# Patient Record
Sex: Female | Born: 1965
Health system: Southern US, Community
[De-identification: ages and names within clinical notes are randomized; demographics above are authoritative.]

## PROBLEM LIST (undated history)

## (undated) DIAGNOSIS — Z95828 Presence of other vascular implants and grafts: Secondary | ICD-10-CM

## (undated) DIAGNOSIS — G4730 Sleep apnea, unspecified: Secondary | ICD-10-CM

## (undated) DIAGNOSIS — R197 Diarrhea, unspecified: Secondary | ICD-10-CM

## (undated) DIAGNOSIS — A419 Sepsis, unspecified organism: Secondary | ICD-10-CM

## (undated) DIAGNOSIS — F419 Anxiety disorder, unspecified: Secondary | ICD-10-CM

## (undated) DIAGNOSIS — G2581 Restless legs syndrome: Secondary | ICD-10-CM

## (undated) DIAGNOSIS — E785 Hyperlipidemia, unspecified: Secondary | ICD-10-CM

## (undated) DIAGNOSIS — M47816 Spondylosis without myelopathy or radiculopathy, lumbar region: Secondary | ICD-10-CM

## (undated) DIAGNOSIS — F32A Depression, unspecified: Secondary | ICD-10-CM

## (undated) DIAGNOSIS — J45909 Unspecified asthma, uncomplicated: Secondary | ICD-10-CM

## (undated) DIAGNOSIS — K219 Gastro-esophageal reflux disease without esophagitis: Secondary | ICD-10-CM

## (undated) HISTORY — DX: Spondylosis without myelopathy or radiculopathy, lumbar region: M47.816

## (undated) HISTORY — DX: Restless legs syndrome: G25.81

## (undated) HISTORY — DX: Sepsis, unspecified organism: A41.9

## (undated) HISTORY — DX: Diarrhea, unspecified: R19.7

## (undated) HISTORY — DX: Unspecified asthma, uncomplicated: J45.909

## (undated) HISTORY — DX: Hyperlipidemia, unspecified: E78.5

## (undated) HISTORY — PX: TONSILLECTOMY: SUR1361

## (undated) HISTORY — DX: Depression, unspecified: F32.A

## (undated) HISTORY — DX: Gastro-esophageal reflux disease without esophagitis: K21.9

## (undated) HISTORY — DX: Sleep apnea, unspecified: G47.30

## (undated) HISTORY — DX: Presence of other vascular implants and grafts: Z95.828

## (undated) HISTORY — DX: Anxiety disorder, unspecified: F41.9

## (undated) HISTORY — PX: OTHER SURGICAL HISTORY: SHX169

## (undated) HISTORY — PX: SPINAL FUSION: SHX223

## (undated) HISTORY — PX: APPENDECTOMY: SHX54

## (undated) HISTORY — PX: SPINAL CORD STIMULATOR INSERTION: SHX5378

## (undated) HISTORY — PX: ECTOPIC PREGNANCY SURGERY: SHX613

---

## 2013-10-28 DIAGNOSIS — M47817 Spondylosis without myelopathy or radiculopathy, lumbosacral region: Secondary | ICD-10-CM | POA: Diagnosis not present

## 2013-10-28 DIAGNOSIS — Z981 Arthrodesis status: Secondary | ICD-10-CM | POA: Diagnosis not present

## 2013-10-28 DIAGNOSIS — M5126 Other intervertebral disc displacement, lumbar region: Secondary | ICD-10-CM | POA: Diagnosis not present

## 2013-10-28 DIAGNOSIS — M48061 Spinal stenosis, lumbar region without neurogenic claudication: Secondary | ICD-10-CM | POA: Diagnosis not present

## 2013-11-03 DIAGNOSIS — M47817 Spondylosis without myelopathy or radiculopathy, lumbosacral region: Secondary | ICD-10-CM | POA: Diagnosis not present

## 2013-11-03 DIAGNOSIS — M4310 Spondylolisthesis, site unspecified: Secondary | ICD-10-CM | POA: Diagnosis not present

## 2013-12-02 DIAGNOSIS — R5381 Other malaise: Secondary | ICD-10-CM | POA: Diagnosis not present

## 2013-12-02 DIAGNOSIS — Z0000 Encounter for general adult medical examination without abnormal findings: Secondary | ICD-10-CM | POA: Diagnosis not present

## 2013-12-02 DIAGNOSIS — E7800 Pure hypercholesterolemia, unspecified: Secondary | ICD-10-CM | POA: Diagnosis not present

## 2013-12-02 DIAGNOSIS — R5383 Other fatigue: Secondary | ICD-10-CM | POA: Diagnosis not present

## 2013-12-06 DIAGNOSIS — E7800 Pure hypercholesterolemia, unspecified: Secondary | ICD-10-CM | POA: Diagnosis not present

## 2013-12-06 DIAGNOSIS — R5383 Other fatigue: Secondary | ICD-10-CM | POA: Diagnosis not present

## 2013-12-06 DIAGNOSIS — R5381 Other malaise: Secondary | ICD-10-CM | POA: Diagnosis not present

## 2013-12-09 DIAGNOSIS — M545 Low back pain, unspecified: Secondary | ICD-10-CM | POA: Diagnosis not present

## 2013-12-09 DIAGNOSIS — J45909 Unspecified asthma, uncomplicated: Secondary | ICD-10-CM | POA: Diagnosis not present

## 2013-12-09 DIAGNOSIS — R339 Retention of urine, unspecified: Secondary | ICD-10-CM | POA: Diagnosis not present

## 2013-12-09 DIAGNOSIS — N3090 Cystitis, unspecified without hematuria: Secondary | ICD-10-CM | POA: Diagnosis not present

## 2013-12-09 DIAGNOSIS — E669 Obesity, unspecified: Secondary | ICD-10-CM | POA: Diagnosis not present

## 2013-12-12 DIAGNOSIS — N926 Irregular menstruation, unspecified: Secondary | ICD-10-CM | POA: Diagnosis not present

## 2013-12-12 DIAGNOSIS — N951 Menopausal and female climacteric states: Secondary | ICD-10-CM | POA: Diagnosis not present

## 2013-12-15 DIAGNOSIS — Z1231 Encounter for screening mammogram for malignant neoplasm of breast: Secondary | ICD-10-CM | POA: Diagnosis not present

## 2013-12-21 DIAGNOSIS — N926 Irregular menstruation, unspecified: Secondary | ICD-10-CM | POA: Diagnosis not present

## 2014-01-12 DIAGNOSIS — N926 Irregular menstruation, unspecified: Secondary | ICD-10-CM | POA: Diagnosis not present

## 2014-01-12 DIAGNOSIS — Z049 Encounter for examination and observation for unspecified reason: Secondary | ICD-10-CM | POA: Diagnosis not present

## 2014-01-12 DIAGNOSIS — R635 Abnormal weight gain: Secondary | ICD-10-CM | POA: Diagnosis not present

## 2014-02-02 DIAGNOSIS — M4310 Spondylolisthesis, site unspecified: Secondary | ICD-10-CM | POA: Diagnosis not present

## 2014-02-02 DIAGNOSIS — M47817 Spondylosis without myelopathy or radiculopathy, lumbosacral region: Secondary | ICD-10-CM | POA: Diagnosis not present

## 2014-07-04 DIAGNOSIS — M47817 Spondylosis without myelopathy or radiculopathy, lumbosacral region: Secondary | ICD-10-CM | POA: Diagnosis not present

## 2014-07-04 DIAGNOSIS — M4310 Spondylolisthesis, site unspecified: Secondary | ICD-10-CM | POA: Diagnosis not present

## 2014-07-18 DIAGNOSIS — H6091 Unspecified otitis externa, right ear: Secondary | ICD-10-CM | POA: Diagnosis not present

## 2014-07-18 DIAGNOSIS — R0982 Postnasal drip: Secondary | ICD-10-CM | POA: Diagnosis not present

## 2014-07-31 DIAGNOSIS — J209 Acute bronchitis, unspecified: Secondary | ICD-10-CM | POA: Diagnosis not present

## 2014-11-01 DIAGNOSIS — F411 Generalized anxiety disorder: Secondary | ICD-10-CM | POA: Diagnosis not present

## 2014-11-02 DIAGNOSIS — M47816 Spondylosis without myelopathy or radiculopathy, lumbar region: Secondary | ICD-10-CM | POA: Diagnosis not present

## 2014-11-02 DIAGNOSIS — M4316 Spondylolisthesis, lumbar region: Secondary | ICD-10-CM | POA: Diagnosis not present

## 2015-01-02 DIAGNOSIS — M4316 Spondylolisthesis, lumbar region: Secondary | ICD-10-CM | POA: Diagnosis not present

## 2015-01-02 DIAGNOSIS — M47816 Spondylosis without myelopathy or radiculopathy, lumbar region: Secondary | ICD-10-CM | POA: Diagnosis not present

## 2015-01-16 DIAGNOSIS — N921 Excessive and frequent menstruation with irregular cycle: Secondary | ICD-10-CM | POA: Diagnosis not present

## 2015-01-16 DIAGNOSIS — N951 Menopausal and female climacteric states: Secondary | ICD-10-CM | POA: Diagnosis not present

## 2015-01-22 DIAGNOSIS — N8501 Benign endometrial hyperplasia: Secondary | ICD-10-CM | POA: Diagnosis not present

## 2015-01-24 DIAGNOSIS — N920 Excessive and frequent menstruation with regular cycle: Secondary | ICD-10-CM | POA: Diagnosis not present

## 2015-01-24 DIAGNOSIS — N943 Premenstrual tension syndrome: Secondary | ICD-10-CM | POA: Diagnosis not present

## 2015-02-13 DIAGNOSIS — J028 Acute pharyngitis due to other specified organisms: Secondary | ICD-10-CM | POA: Diagnosis not present

## 2015-04-19 DIAGNOSIS — M47816 Spondylosis without myelopathy or radiculopathy, lumbar region: Secondary | ICD-10-CM | POA: Diagnosis not present

## 2015-04-19 DIAGNOSIS — M4316 Spondylolisthesis, lumbar region: Secondary | ICD-10-CM | POA: Diagnosis not present

## 2015-05-15 DIAGNOSIS — B850 Pediculosis due to Pediculus humanus capitis: Secondary | ICD-10-CM | POA: Diagnosis not present

## 2015-06-18 DIAGNOSIS — J029 Acute pharyngitis, unspecified: Secondary | ICD-10-CM | POA: Diagnosis not present

## 2015-06-18 DIAGNOSIS — R5383 Other fatigue: Secondary | ICD-10-CM | POA: Diagnosis not present

## 2015-06-18 DIAGNOSIS — F411 Generalized anxiety disorder: Secondary | ICD-10-CM | POA: Diagnosis not present

## 2015-06-18 DIAGNOSIS — R05 Cough: Secondary | ICD-10-CM | POA: Diagnosis not present

## 2015-07-12 DIAGNOSIS — G43909 Migraine, unspecified, not intractable, without status migrainosus: Secondary | ICD-10-CM | POA: Diagnosis not present

## 2015-07-30 DIAGNOSIS — M4316 Spondylolisthesis, lumbar region: Secondary | ICD-10-CM | POA: Diagnosis not present

## 2015-07-30 DIAGNOSIS — M47816 Spondylosis without myelopathy or radiculopathy, lumbar region: Secondary | ICD-10-CM | POA: Diagnosis not present

## 2015-07-31 DIAGNOSIS — M4326 Fusion of spine, lumbar region: Secondary | ICD-10-CM | POA: Diagnosis not present

## 2015-07-31 DIAGNOSIS — M47816 Spondylosis without myelopathy or radiculopathy, lumbar region: Secondary | ICD-10-CM | POA: Diagnosis not present

## 2015-07-31 DIAGNOSIS — Z981 Arthrodesis status: Secondary | ICD-10-CM | POA: Diagnosis not present

## 2015-08-06 DIAGNOSIS — J209 Acute bronchitis, unspecified: Secondary | ICD-10-CM | POA: Diagnosis not present

## 2015-08-06 DIAGNOSIS — R079 Chest pain, unspecified: Secondary | ICD-10-CM | POA: Diagnosis not present

## 2015-08-06 DIAGNOSIS — F411 Generalized anxiety disorder: Secondary | ICD-10-CM | POA: Diagnosis not present

## 2015-08-06 DIAGNOSIS — R05 Cough: Secondary | ICD-10-CM | POA: Diagnosis not present

## 2015-08-16 DIAGNOSIS — F411 Generalized anxiety disorder: Secondary | ICD-10-CM | POA: Diagnosis not present

## 2015-08-16 DIAGNOSIS — F5101 Primary insomnia: Secondary | ICD-10-CM | POA: Diagnosis not present

## 2015-08-27 DIAGNOSIS — J029 Acute pharyngitis, unspecified: Secondary | ICD-10-CM | POA: Diagnosis not present

## 2015-08-27 DIAGNOSIS — H9201 Otalgia, right ear: Secondary | ICD-10-CM | POA: Diagnosis not present

## 2015-09-06 HISTORY — PX: COLONOSCOPY: SHX174

## 2015-09-12 DIAGNOSIS — M7711 Lateral epicondylitis, right elbow: Secondary | ICD-10-CM | POA: Diagnosis not present

## 2015-09-12 DIAGNOSIS — F411 Generalized anxiety disorder: Secondary | ICD-10-CM | POA: Diagnosis not present

## 2015-09-13 DIAGNOSIS — K625 Hemorrhage of anus and rectum: Secondary | ICD-10-CM | POA: Diagnosis not present

## 2015-09-18 DIAGNOSIS — K644 Residual hemorrhoidal skin tags: Secondary | ICD-10-CM | POA: Diagnosis not present

## 2015-09-18 DIAGNOSIS — Z880 Allergy status to penicillin: Secondary | ICD-10-CM | POA: Diagnosis not present

## 2015-09-18 DIAGNOSIS — Z808 Family history of malignant neoplasm of other organs or systems: Secondary | ICD-10-CM | POA: Diagnosis not present

## 2015-09-18 DIAGNOSIS — K6289 Other specified diseases of anus and rectum: Secondary | ICD-10-CM | POA: Diagnosis not present

## 2015-09-18 DIAGNOSIS — Z87891 Personal history of nicotine dependence: Secondary | ICD-10-CM | POA: Diagnosis not present

## 2015-09-18 DIAGNOSIS — F419 Anxiety disorder, unspecified: Secondary | ICD-10-CM | POA: Diagnosis not present

## 2015-09-18 DIAGNOSIS — K5730 Diverticulosis of large intestine without perforation or abscess without bleeding: Secondary | ICD-10-CM | POA: Diagnosis not present

## 2015-09-18 DIAGNOSIS — Z8041 Family history of malignant neoplasm of ovary: Secondary | ICD-10-CM | POA: Diagnosis not present

## 2015-09-18 DIAGNOSIS — Z79899 Other long term (current) drug therapy: Secondary | ICD-10-CM | POA: Diagnosis not present

## 2015-09-18 DIAGNOSIS — E7800 Pure hypercholesterolemia, unspecified: Secondary | ICD-10-CM | POA: Diagnosis not present

## 2015-09-18 DIAGNOSIS — G43909 Migraine, unspecified, not intractable, without status migrainosus: Secondary | ICD-10-CM | POA: Diagnosis not present

## 2015-09-18 DIAGNOSIS — J45909 Unspecified asthma, uncomplicated: Secondary | ICD-10-CM | POA: Diagnosis not present

## 2015-09-18 DIAGNOSIS — K625 Hemorrhage of anus and rectum: Secondary | ICD-10-CM | POA: Diagnosis not present

## 2015-09-18 DIAGNOSIS — Z836 Family history of other diseases of the respiratory system: Secondary | ICD-10-CM | POA: Diagnosis not present

## 2015-09-18 DIAGNOSIS — K641 Second degree hemorrhoids: Secondary | ICD-10-CM | POA: Diagnosis not present

## 2015-09-21 DIAGNOSIS — M7711 Lateral epicondylitis, right elbow: Secondary | ICD-10-CM | POA: Diagnosis not present

## 2015-09-21 DIAGNOSIS — F411 Generalized anxiety disorder: Secondary | ICD-10-CM | POA: Diagnosis not present

## 2015-10-24 DIAGNOSIS — R110 Nausea: Secondary | ICD-10-CM | POA: Diagnosis not present

## 2015-10-24 DIAGNOSIS — G43909 Migraine, unspecified, not intractable, without status migrainosus: Secondary | ICD-10-CM | POA: Diagnosis not present

## 2015-10-24 DIAGNOSIS — J0101 Acute recurrent maxillary sinusitis: Secondary | ICD-10-CM | POA: Diagnosis not present

## 2015-10-30 DIAGNOSIS — M4316 Spondylolisthesis, lumbar region: Secondary | ICD-10-CM | POA: Diagnosis not present

## 2015-10-30 DIAGNOSIS — M47816 Spondylosis without myelopathy or radiculopathy, lumbar region: Secondary | ICD-10-CM | POA: Diagnosis not present

## 2015-11-15 DIAGNOSIS — J209 Acute bronchitis, unspecified: Secondary | ICD-10-CM | POA: Diagnosis not present

## 2015-11-15 DIAGNOSIS — R509 Fever, unspecified: Secondary | ICD-10-CM | POA: Diagnosis not present

## 2015-11-15 DIAGNOSIS — J020 Streptococcal pharyngitis: Secondary | ICD-10-CM | POA: Diagnosis not present

## 2016-02-05 DIAGNOSIS — M4316 Spondylolisthesis, lumbar region: Secondary | ICD-10-CM | POA: Diagnosis not present

## 2016-02-05 DIAGNOSIS — M47816 Spondylosis without myelopathy or radiculopathy, lumbar region: Secondary | ICD-10-CM | POA: Diagnosis not present

## 2016-02-07 DIAGNOSIS — J019 Acute sinusitis, unspecified: Secondary | ICD-10-CM | POA: Diagnosis not present

## 2016-02-07 DIAGNOSIS — R0981 Nasal congestion: Secondary | ICD-10-CM | POA: Diagnosis not present

## 2016-02-07 DIAGNOSIS — R05 Cough: Secondary | ICD-10-CM | POA: Diagnosis not present

## 2016-03-11 DIAGNOSIS — J45909 Unspecified asthma, uncomplicated: Secondary | ICD-10-CM | POA: Diagnosis not present

## 2016-03-11 DIAGNOSIS — F419 Anxiety disorder, unspecified: Secondary | ICD-10-CM | POA: Diagnosis not present

## 2016-03-11 DIAGNOSIS — M479 Spondylosis, unspecified: Secondary | ICD-10-CM | POA: Diagnosis not present

## 2016-03-11 DIAGNOSIS — Z79899 Other long term (current) drug therapy: Secondary | ICD-10-CM | POA: Diagnosis not present

## 2016-03-11 DIAGNOSIS — M545 Low back pain: Secondary | ICD-10-CM | POA: Diagnosis not present

## 2016-03-11 DIAGNOSIS — R1030 Lower abdominal pain, unspecified: Secondary | ICD-10-CM | POA: Diagnosis not present

## 2016-03-11 DIAGNOSIS — R109 Unspecified abdominal pain: Secondary | ICD-10-CM | POA: Diagnosis not present

## 2016-03-11 DIAGNOSIS — M549 Dorsalgia, unspecified: Secondary | ICD-10-CM | POA: Diagnosis not present

## 2016-03-11 DIAGNOSIS — G8929 Other chronic pain: Secondary | ICD-10-CM | POA: Diagnosis not present

## 2016-05-07 DIAGNOSIS — M47816 Spondylosis without myelopathy or radiculopathy, lumbar region: Secondary | ICD-10-CM | POA: Diagnosis not present

## 2016-05-07 DIAGNOSIS — M4316 Spondylolisthesis, lumbar region: Secondary | ICD-10-CM | POA: Diagnosis not present

## 2016-06-10 DIAGNOSIS — F4542 Pain disorder with related psychological factors: Secondary | ICD-10-CM | POA: Diagnosis not present

## 2016-06-10 DIAGNOSIS — F411 Generalized anxiety disorder: Secondary | ICD-10-CM | POA: Diagnosis not present

## 2016-06-10 DIAGNOSIS — F341 Dysthymic disorder: Secondary | ICD-10-CM | POA: Diagnosis not present

## 2016-06-10 DIAGNOSIS — M4726 Other spondylosis with radiculopathy, lumbar region: Secondary | ICD-10-CM | POA: Diagnosis not present

## 2016-07-11 DIAGNOSIS — M47816 Spondylosis without myelopathy or radiculopathy, lumbar region: Secondary | ICD-10-CM | POA: Diagnosis not present

## 2016-07-11 DIAGNOSIS — M4316 Spondylolisthesis, lumbar region: Secondary | ICD-10-CM | POA: Diagnosis not present

## 2016-08-13 DIAGNOSIS — J029 Acute pharyngitis, unspecified: Secondary | ICD-10-CM | POA: Diagnosis not present

## 2016-08-13 DIAGNOSIS — Z6832 Body mass index (BMI) 32.0-32.9, adult: Secondary | ICD-10-CM | POA: Diagnosis not present

## 2016-08-13 DIAGNOSIS — F411 Generalized anxiety disorder: Secondary | ICD-10-CM | POA: Diagnosis not present

## 2016-08-18 DIAGNOSIS — M47896 Other spondylosis, lumbar region: Secondary | ICD-10-CM | POA: Diagnosis not present

## 2016-08-20 DIAGNOSIS — Z969 Presence of functional implant, unspecified: Secondary | ICD-10-CM | POA: Diagnosis not present

## 2016-08-20 DIAGNOSIS — M4716 Other spondylosis with myelopathy, lumbar region: Secondary | ICD-10-CM | POA: Diagnosis not present

## 2016-08-20 DIAGNOSIS — M47896 Other spondylosis, lumbar region: Secondary | ICD-10-CM | POA: Diagnosis not present

## 2016-08-20 DIAGNOSIS — M47816 Spondylosis without myelopathy or radiculopathy, lumbar region: Secondary | ICD-10-CM | POA: Diagnosis not present

## 2016-08-21 DIAGNOSIS — M47896 Other spondylosis, lumbar region: Secondary | ICD-10-CM | POA: Diagnosis not present

## 2016-08-25 DIAGNOSIS — M4316 Spondylolisthesis, lumbar region: Secondary | ICD-10-CM | POA: Diagnosis not present

## 2016-08-25 DIAGNOSIS — M47896 Other spondylosis, lumbar region: Secondary | ICD-10-CM | POA: Diagnosis not present

## 2016-08-25 DIAGNOSIS — M47816 Spondylosis without myelopathy or radiculopathy, lumbar region: Secondary | ICD-10-CM | POA: Diagnosis not present

## 2016-08-26 DIAGNOSIS — M47896 Other spondylosis, lumbar region: Secondary | ICD-10-CM | POA: Diagnosis not present

## 2016-08-26 DIAGNOSIS — M4316 Spondylolisthesis, lumbar region: Secondary | ICD-10-CM | POA: Diagnosis not present

## 2016-08-30 DIAGNOSIS — R2689 Other abnormalities of gait and mobility: Secondary | ICD-10-CM | POA: Diagnosis not present

## 2016-08-30 DIAGNOSIS — Z4789 Encounter for other orthopedic aftercare: Secondary | ICD-10-CM | POA: Diagnosis not present

## 2016-08-30 DIAGNOSIS — M6281 Muscle weakness (generalized): Secondary | ICD-10-CM | POA: Diagnosis not present

## 2016-08-30 DIAGNOSIS — M47816 Spondylosis without myelopathy or radiculopathy, lumbar region: Secondary | ICD-10-CM | POA: Diagnosis not present

## 2016-08-31 DIAGNOSIS — B999 Unspecified infectious disease: Secondary | ICD-10-CM | POA: Diagnosis not present

## 2016-08-31 DIAGNOSIS — F419 Anxiety disorder, unspecified: Secondary | ICD-10-CM | POA: Diagnosis not present

## 2016-08-31 DIAGNOSIS — T814XXA Infection following a procedure, initial encounter: Secondary | ICD-10-CM | POA: Diagnosis not present

## 2016-08-31 DIAGNOSIS — Z79899 Other long term (current) drug therapy: Secondary | ICD-10-CM | POA: Diagnosis not present

## 2016-09-01 DIAGNOSIS — M47816 Spondylosis without myelopathy or radiculopathy, lumbar region: Secondary | ICD-10-CM | POA: Diagnosis not present

## 2016-09-01 DIAGNOSIS — R2689 Other abnormalities of gait and mobility: Secondary | ICD-10-CM | POA: Diagnosis not present

## 2016-09-01 DIAGNOSIS — Z4789 Encounter for other orthopedic aftercare: Secondary | ICD-10-CM | POA: Diagnosis not present

## 2016-09-01 DIAGNOSIS — M6281 Muscle weakness (generalized): Secondary | ICD-10-CM | POA: Diagnosis not present

## 2016-09-02 DIAGNOSIS — R2689 Other abnormalities of gait and mobility: Secondary | ICD-10-CM | POA: Diagnosis not present

## 2016-09-02 DIAGNOSIS — M47816 Spondylosis without myelopathy or radiculopathy, lumbar region: Secondary | ICD-10-CM | POA: Diagnosis not present

## 2016-09-02 DIAGNOSIS — M6281 Muscle weakness (generalized): Secondary | ICD-10-CM | POA: Diagnosis not present

## 2016-09-02 DIAGNOSIS — Z4789 Encounter for other orthopedic aftercare: Secondary | ICD-10-CM | POA: Diagnosis not present

## 2016-09-03 DIAGNOSIS — Y838 Other surgical procedures as the cause of abnormal reaction of the patient, or of later complication, without mention of misadventure at the time of the procedure: Secondary | ICD-10-CM | POA: Diagnosis not present

## 2016-09-03 DIAGNOSIS — Z880 Allergy status to penicillin: Secondary | ICD-10-CM | POA: Diagnosis not present

## 2016-09-03 DIAGNOSIS — T8579XA Infection and inflammatory reaction due to other internal prosthetic devices, implants and grafts, initial encounter: Secondary | ICD-10-CM | POA: Diagnosis not present

## 2016-09-03 DIAGNOSIS — Z79899 Other long term (current) drug therapy: Secondary | ICD-10-CM | POA: Diagnosis not present

## 2016-09-03 DIAGNOSIS — T85192A Other mechanical complication of implanted electronic neurostimulator (electrode) of spinal cord, initial encounter: Secondary | ICD-10-CM | POA: Diagnosis not present

## 2016-09-03 DIAGNOSIS — Z981 Arthrodesis status: Secondary | ICD-10-CM | POA: Diagnosis not present

## 2016-09-03 DIAGNOSIS — Z791 Long term (current) use of non-steroidal anti-inflammatories (NSAID): Secondary | ICD-10-CM | POA: Diagnosis not present

## 2016-09-03 DIAGNOSIS — Z825 Family history of asthma and other chronic lower respiratory diseases: Secondary | ICD-10-CM | POA: Diagnosis not present

## 2016-09-03 DIAGNOSIS — Z79891 Long term (current) use of opiate analgesic: Secondary | ICD-10-CM | POA: Diagnosis not present

## 2016-09-03 DIAGNOSIS — Z888 Allergy status to other drugs, medicaments and biological substances status: Secondary | ICD-10-CM | POA: Diagnosis not present

## 2016-09-03 DIAGNOSIS — J449 Chronic obstructive pulmonary disease, unspecified: Secondary | ICD-10-CM | POA: Diagnosis not present

## 2016-09-03 DIAGNOSIS — T85734A Infection and inflammatory reaction due to implanted electronic neurostimulator, generator, initial encounter: Secondary | ICD-10-CM | POA: Diagnosis not present

## 2016-09-03 DIAGNOSIS — Y753 Surgical instruments, materials and neurological devices (including sutures) associated with adverse incidents: Secondary | ICD-10-CM | POA: Diagnosis not present

## 2016-09-03 DIAGNOSIS — Z809 Family history of malignant neoplasm, unspecified: Secondary | ICD-10-CM | POA: Diagnosis not present

## 2016-09-04 DIAGNOSIS — Z452 Encounter for adjustment and management of vascular access device: Secondary | ICD-10-CM | POA: Diagnosis not present

## 2016-09-04 DIAGNOSIS — Z79899 Other long term (current) drug therapy: Secondary | ICD-10-CM | POA: Diagnosis not present

## 2016-09-04 DIAGNOSIS — M545 Low back pain: Secondary | ICD-10-CM | POA: Diagnosis not present

## 2016-09-04 DIAGNOSIS — T85734A Infection and inflammatory reaction due to implanted electronic neurostimulator, generator, initial encounter: Secondary | ICD-10-CM | POA: Diagnosis present

## 2016-09-04 DIAGNOSIS — Z79891 Long term (current) use of opiate analgesic: Secondary | ICD-10-CM | POA: Diagnosis not present

## 2016-09-04 DIAGNOSIS — J449 Chronic obstructive pulmonary disease, unspecified: Secondary | ICD-10-CM | POA: Diagnosis present

## 2016-09-04 DIAGNOSIS — Z791 Long term (current) use of non-steroidal anti-inflammatories (NSAID): Secondary | ICD-10-CM | POA: Diagnosis not present

## 2016-09-04 DIAGNOSIS — Z880 Allergy status to penicillin: Secondary | ICD-10-CM | POA: Diagnosis not present

## 2016-09-04 DIAGNOSIS — T85733A Infection and inflammatory reaction due to implanted electronic neurostimulator of spinal cord, electrode (lead), initial encounter: Secondary | ICD-10-CM | POA: Diagnosis not present

## 2016-09-04 DIAGNOSIS — Z792 Long term (current) use of antibiotics: Secondary | ICD-10-CM | POA: Diagnosis not present

## 2016-09-04 DIAGNOSIS — G8929 Other chronic pain: Secondary | ICD-10-CM | POA: Diagnosis not present

## 2016-09-04 DIAGNOSIS — Z825 Family history of asthma and other chronic lower respiratory diseases: Secondary | ICD-10-CM | POA: Diagnosis not present

## 2016-09-04 DIAGNOSIS — Z981 Arthrodesis status: Secondary | ICD-10-CM | POA: Diagnosis not present

## 2016-09-04 DIAGNOSIS — Z888 Allergy status to other drugs, medicaments and biological substances status: Secondary | ICD-10-CM | POA: Diagnosis not present

## 2016-09-04 DIAGNOSIS — Z809 Family history of malignant neoplasm, unspecified: Secondary | ICD-10-CM | POA: Diagnosis not present

## 2016-09-08 DIAGNOSIS — G8929 Other chronic pain: Secondary | ICD-10-CM | POA: Diagnosis not present

## 2016-09-08 DIAGNOSIS — J449 Chronic obstructive pulmonary disease, unspecified: Secondary | ICD-10-CM | POA: Diagnosis not present

## 2016-09-08 DIAGNOSIS — T85733A Infection and inflammatory reaction due to implanted electronic neurostimulator of spinal cord, electrode (lead), initial encounter: Secondary | ICD-10-CM | POA: Diagnosis not present

## 2016-09-08 DIAGNOSIS — Z792 Long term (current) use of antibiotics: Secondary | ICD-10-CM | POA: Diagnosis not present

## 2016-09-08 DIAGNOSIS — Z452 Encounter for adjustment and management of vascular access device: Secondary | ICD-10-CM | POA: Diagnosis not present

## 2016-09-08 DIAGNOSIS — M545 Low back pain: Secondary | ICD-10-CM | POA: Diagnosis not present

## 2016-09-10 DIAGNOSIS — J449 Chronic obstructive pulmonary disease, unspecified: Secondary | ICD-10-CM | POA: Diagnosis not present

## 2016-09-10 DIAGNOSIS — M545 Low back pain: Secondary | ICD-10-CM | POA: Diagnosis not present

## 2016-09-10 DIAGNOSIS — T85733A Infection and inflammatory reaction due to implanted electronic neurostimulator of spinal cord, electrode (lead), initial encounter: Secondary | ICD-10-CM | POA: Diagnosis not present

## 2016-09-10 DIAGNOSIS — G8929 Other chronic pain: Secondary | ICD-10-CM | POA: Diagnosis not present

## 2016-09-10 DIAGNOSIS — Z792 Long term (current) use of antibiotics: Secondary | ICD-10-CM | POA: Diagnosis not present

## 2016-09-10 DIAGNOSIS — Z452 Encounter for adjustment and management of vascular access device: Secondary | ICD-10-CM | POA: Diagnosis not present

## 2016-09-10 DIAGNOSIS — T8579XA Infection and inflammatory reaction due to other internal prosthetic devices, implants and grafts, initial encounter: Secondary | ICD-10-CM | POA: Diagnosis not present

## 2016-09-10 DIAGNOSIS — A499 Bacterial infection, unspecified: Secondary | ICD-10-CM | POA: Diagnosis not present

## 2016-09-17 DIAGNOSIS — J449 Chronic obstructive pulmonary disease, unspecified: Secondary | ICD-10-CM | POA: Diagnosis not present

## 2016-09-17 DIAGNOSIS — Z792 Long term (current) use of antibiotics: Secondary | ICD-10-CM | POA: Diagnosis not present

## 2016-09-17 DIAGNOSIS — M545 Low back pain: Secondary | ICD-10-CM | POA: Diagnosis not present

## 2016-09-17 DIAGNOSIS — M4636 Infection of intervertebral disc (pyogenic), lumbar region: Secondary | ICD-10-CM | POA: Diagnosis not present

## 2016-09-17 DIAGNOSIS — G8929 Other chronic pain: Secondary | ICD-10-CM | POA: Diagnosis not present

## 2016-09-17 DIAGNOSIS — T85733A Infection and inflammatory reaction due to implanted electronic neurostimulator of spinal cord, electrode (lead), initial encounter: Secondary | ICD-10-CM | POA: Diagnosis not present

## 2016-09-17 DIAGNOSIS — Z452 Encounter for adjustment and management of vascular access device: Secondary | ICD-10-CM | POA: Diagnosis not present

## 2016-09-24 DIAGNOSIS — J449 Chronic obstructive pulmonary disease, unspecified: Secondary | ICD-10-CM | POA: Diagnosis not present

## 2016-09-24 DIAGNOSIS — M545 Low back pain: Secondary | ICD-10-CM | POA: Diagnosis not present

## 2016-09-24 DIAGNOSIS — Z452 Encounter for adjustment and management of vascular access device: Secondary | ICD-10-CM | POA: Diagnosis not present

## 2016-09-24 DIAGNOSIS — G8929 Other chronic pain: Secondary | ICD-10-CM | POA: Diagnosis not present

## 2016-09-24 DIAGNOSIS — T85733A Infection and inflammatory reaction due to implanted electronic neurostimulator of spinal cord, electrode (lead), initial encounter: Secondary | ICD-10-CM | POA: Diagnosis not present

## 2016-09-24 DIAGNOSIS — Z792 Long term (current) use of antibiotics: Secondary | ICD-10-CM | POA: Diagnosis not present

## 2016-10-01 DIAGNOSIS — Z792 Long term (current) use of antibiotics: Secondary | ICD-10-CM | POA: Diagnosis not present

## 2016-10-01 DIAGNOSIS — Z452 Encounter for adjustment and management of vascular access device: Secondary | ICD-10-CM | POA: Diagnosis not present

## 2016-10-01 DIAGNOSIS — T85733A Infection and inflammatory reaction due to implanted electronic neurostimulator of spinal cord, electrode (lead), initial encounter: Secondary | ICD-10-CM | POA: Diagnosis not present

## 2016-10-01 DIAGNOSIS — G8929 Other chronic pain: Secondary | ICD-10-CM | POA: Diagnosis not present

## 2016-10-01 DIAGNOSIS — J449 Chronic obstructive pulmonary disease, unspecified: Secondary | ICD-10-CM | POA: Diagnosis not present

## 2016-10-01 DIAGNOSIS — M545 Low back pain: Secondary | ICD-10-CM | POA: Diagnosis not present

## 2016-10-08 DIAGNOSIS — T85733A Infection and inflammatory reaction due to implanted electronic neurostimulator of spinal cord, electrode (lead), initial encounter: Secondary | ICD-10-CM | POA: Diagnosis not present

## 2016-10-08 DIAGNOSIS — G8929 Other chronic pain: Secondary | ICD-10-CM | POA: Diagnosis not present

## 2016-10-08 DIAGNOSIS — M545 Low back pain: Secondary | ICD-10-CM | POA: Diagnosis not present

## 2016-10-08 DIAGNOSIS — Z792 Long term (current) use of antibiotics: Secondary | ICD-10-CM | POA: Diagnosis not present

## 2016-10-08 DIAGNOSIS — Z452 Encounter for adjustment and management of vascular access device: Secondary | ICD-10-CM | POA: Diagnosis not present

## 2016-10-08 DIAGNOSIS — J449 Chronic obstructive pulmonary disease, unspecified: Secondary | ICD-10-CM | POA: Diagnosis not present

## 2016-10-13 DIAGNOSIS — Z87891 Personal history of nicotine dependence: Secondary | ICD-10-CM | POA: Diagnosis not present

## 2016-10-13 DIAGNOSIS — R51 Headache: Secondary | ICD-10-CM | POA: Diagnosis not present

## 2016-10-13 DIAGNOSIS — R091 Pleurisy: Secondary | ICD-10-CM | POA: Diagnosis not present

## 2016-10-13 DIAGNOSIS — R0602 Shortness of breath: Secondary | ICD-10-CM | POA: Diagnosis not present

## 2016-10-13 DIAGNOSIS — F419 Anxiety disorder, unspecified: Secondary | ICD-10-CM | POA: Diagnosis not present

## 2016-10-13 DIAGNOSIS — Z79899 Other long term (current) drug therapy: Secondary | ICD-10-CM | POA: Diagnosis not present

## 2016-10-13 DIAGNOSIS — Z791 Long term (current) use of non-steroidal anti-inflammatories (NSAID): Secondary | ICD-10-CM | POA: Diagnosis not present

## 2016-10-14 DIAGNOSIS — M545 Low back pain: Secondary | ICD-10-CM | POA: Diagnosis not present

## 2016-10-14 DIAGNOSIS — G8929 Other chronic pain: Secondary | ICD-10-CM | POA: Diagnosis not present

## 2016-10-14 DIAGNOSIS — J449 Chronic obstructive pulmonary disease, unspecified: Secondary | ICD-10-CM | POA: Diagnosis not present

## 2016-10-14 DIAGNOSIS — Z452 Encounter for adjustment and management of vascular access device: Secondary | ICD-10-CM | POA: Diagnosis not present

## 2016-10-14 DIAGNOSIS — Z792 Long term (current) use of antibiotics: Secondary | ICD-10-CM | POA: Diagnosis not present

## 2016-10-14 DIAGNOSIS — T85733A Infection and inflammatory reaction due to implanted electronic neurostimulator of spinal cord, electrode (lead), initial encounter: Secondary | ICD-10-CM | POA: Diagnosis not present

## 2016-10-17 DIAGNOSIS — Z792 Long term (current) use of antibiotics: Secondary | ICD-10-CM | POA: Diagnosis not present

## 2016-10-17 DIAGNOSIS — T85733A Infection and inflammatory reaction due to implanted electronic neurostimulator of spinal cord, electrode (lead), initial encounter: Secondary | ICD-10-CM | POA: Diagnosis not present

## 2016-10-17 DIAGNOSIS — G8929 Other chronic pain: Secondary | ICD-10-CM | POA: Diagnosis not present

## 2016-10-17 DIAGNOSIS — M545 Low back pain: Secondary | ICD-10-CM | POA: Diagnosis not present

## 2016-10-17 DIAGNOSIS — Z452 Encounter for adjustment and management of vascular access device: Secondary | ICD-10-CM | POA: Diagnosis not present

## 2016-10-17 DIAGNOSIS — J449 Chronic obstructive pulmonary disease, unspecified: Secondary | ICD-10-CM | POA: Diagnosis not present

## 2016-10-30 DIAGNOSIS — F411 Generalized anxiety disorder: Secondary | ICD-10-CM | POA: Diagnosis not present

## 2016-10-30 DIAGNOSIS — Z13220 Encounter for screening for lipoid disorders: Secondary | ICD-10-CM | POA: Diagnosis not present

## 2016-10-30 DIAGNOSIS — F5101 Primary insomnia: Secondary | ICD-10-CM | POA: Diagnosis not present

## 2016-10-30 DIAGNOSIS — Z6834 Body mass index (BMI) 34.0-34.9, adult: Secondary | ICD-10-CM | POA: Diagnosis not present

## 2016-10-30 DIAGNOSIS — Z0000 Encounter for general adult medical examination without abnormal findings: Secondary | ICD-10-CM | POA: Diagnosis not present

## 2016-10-30 DIAGNOSIS — G43909 Migraine, unspecified, not intractable, without status migrainosus: Secondary | ICD-10-CM | POA: Diagnosis not present

## 2016-10-30 DIAGNOSIS — Z1389 Encounter for screening for other disorder: Secondary | ICD-10-CM | POA: Diagnosis not present

## 2016-10-30 DIAGNOSIS — E782 Mixed hyperlipidemia: Secondary | ICD-10-CM | POA: Diagnosis not present

## 2016-10-30 DIAGNOSIS — E7800 Pure hypercholesterolemia, unspecified: Secondary | ICD-10-CM | POA: Diagnosis not present

## 2016-11-11 DIAGNOSIS — M79605 Pain in left leg: Secondary | ICD-10-CM | POA: Diagnosis not present

## 2016-11-11 DIAGNOSIS — Z981 Arthrodesis status: Secondary | ICD-10-CM | POA: Diagnosis not present

## 2016-11-11 DIAGNOSIS — M545 Low back pain: Secondary | ICD-10-CM | POA: Diagnosis not present

## 2016-11-11 DIAGNOSIS — M47816 Spondylosis without myelopathy or radiculopathy, lumbar region: Secondary | ICD-10-CM | POA: Diagnosis not present

## 2016-11-11 DIAGNOSIS — M9983 Other biomechanical lesions of lumbar region: Secondary | ICD-10-CM | POA: Diagnosis not present

## 2016-11-13 DIAGNOSIS — Z1231 Encounter for screening mammogram for malignant neoplasm of breast: Secondary | ICD-10-CM | POA: Diagnosis not present

## 2016-11-19 DIAGNOSIS — M47816 Spondylosis without myelopathy or radiculopathy, lumbar region: Secondary | ICD-10-CM | POA: Diagnosis not present

## 2016-11-24 DIAGNOSIS — R509 Fever, unspecified: Secondary | ICD-10-CM | POA: Diagnosis not present

## 2016-11-28 DIAGNOSIS — F411 Generalized anxiety disorder: Secondary | ICD-10-CM | POA: Diagnosis not present

## 2016-11-28 DIAGNOSIS — G43909 Migraine, unspecified, not intractable, without status migrainosus: Secondary | ICD-10-CM | POA: Diagnosis not present

## 2016-11-28 DIAGNOSIS — F5101 Primary insomnia: Secondary | ICD-10-CM | POA: Diagnosis not present

## 2016-11-28 DIAGNOSIS — E782 Mixed hyperlipidemia: Secondary | ICD-10-CM | POA: Diagnosis not present

## 2016-11-28 DIAGNOSIS — Z6833 Body mass index (BMI) 33.0-33.9, adult: Secondary | ICD-10-CM | POA: Diagnosis not present

## 2016-12-08 DIAGNOSIS — R509 Fever, unspecified: Secondary | ICD-10-CM | POA: Diagnosis not present

## 2017-01-06 DIAGNOSIS — Z6833 Body mass index (BMI) 33.0-33.9, adult: Secondary | ICD-10-CM | POA: Diagnosis not present

## 2017-01-06 DIAGNOSIS — R202 Paresthesia of skin: Secondary | ICD-10-CM | POA: Diagnosis not present

## 2017-01-15 DIAGNOSIS — M545 Low back pain: Secondary | ICD-10-CM | POA: Diagnosis not present

## 2017-01-15 DIAGNOSIS — G5601 Carpal tunnel syndrome, right upper limb: Secondary | ICD-10-CM | POA: Diagnosis not present

## 2017-01-15 DIAGNOSIS — F419 Anxiety disorder, unspecified: Secondary | ICD-10-CM | POA: Diagnosis not present

## 2017-01-15 DIAGNOSIS — G5602 Carpal tunnel syndrome, left upper limb: Secondary | ICD-10-CM | POA: Diagnosis not present

## 2017-01-15 DIAGNOSIS — M79643 Pain in unspecified hand: Secondary | ICD-10-CM | POA: Diagnosis not present

## 2017-01-15 DIAGNOSIS — E785 Hyperlipidemia, unspecified: Secondary | ICD-10-CM | POA: Diagnosis not present

## 2017-01-16 DIAGNOSIS — R509 Fever, unspecified: Secondary | ICD-10-CM | POA: Diagnosis not present

## 2017-01-16 DIAGNOSIS — E782 Mixed hyperlipidemia: Secondary | ICD-10-CM | POA: Diagnosis not present

## 2017-01-16 DIAGNOSIS — E7800 Pure hypercholesterolemia, unspecified: Secondary | ICD-10-CM | POA: Diagnosis not present

## 2017-01-16 DIAGNOSIS — M47816 Spondylosis without myelopathy or radiculopathy, lumbar region: Secondary | ICD-10-CM | POA: Diagnosis not present

## 2017-01-16 DIAGNOSIS — F411 Generalized anxiety disorder: Secondary | ICD-10-CM | POA: Diagnosis not present

## 2017-01-16 DIAGNOSIS — R5383 Other fatigue: Secondary | ICD-10-CM | POA: Diagnosis not present

## 2017-01-27 DIAGNOSIS — F5101 Primary insomnia: Secondary | ICD-10-CM | POA: Diagnosis not present

## 2017-01-27 DIAGNOSIS — Z6833 Body mass index (BMI) 33.0-33.9, adult: Secondary | ICD-10-CM | POA: Diagnosis not present

## 2017-01-27 DIAGNOSIS — E782 Mixed hyperlipidemia: Secondary | ICD-10-CM | POA: Diagnosis not present

## 2017-01-27 DIAGNOSIS — F411 Generalized anxiety disorder: Secondary | ICD-10-CM | POA: Diagnosis not present

## 2017-01-27 DIAGNOSIS — G43909 Migraine, unspecified, not intractable, without status migrainosus: Secondary | ICD-10-CM | POA: Diagnosis not present

## 2017-01-28 DIAGNOSIS — M4316 Spondylolisthesis, lumbar region: Secondary | ICD-10-CM | POA: Diagnosis not present

## 2017-01-28 DIAGNOSIS — M47816 Spondylosis without myelopathy or radiculopathy, lumbar region: Secondary | ICD-10-CM | POA: Diagnosis not present

## 2017-02-12 DIAGNOSIS — G5601 Carpal tunnel syndrome, right upper limb: Secondary | ICD-10-CM | POA: Diagnosis not present

## 2017-02-12 DIAGNOSIS — G5602 Carpal tunnel syndrome, left upper limb: Secondary | ICD-10-CM | POA: Diagnosis not present

## 2017-02-12 DIAGNOSIS — M5412 Radiculopathy, cervical region: Secondary | ICD-10-CM | POA: Diagnosis not present

## 2017-02-18 DIAGNOSIS — Z6834 Body mass index (BMI) 34.0-34.9, adult: Secondary | ICD-10-CM | POA: Diagnosis not present

## 2017-02-18 DIAGNOSIS — J0100 Acute maxillary sinusitis, unspecified: Secondary | ICD-10-CM | POA: Diagnosis not present

## 2017-02-26 DIAGNOSIS — M4316 Spondylolisthesis, lumbar region: Secondary | ICD-10-CM | POA: Diagnosis not present

## 2017-02-26 DIAGNOSIS — R509 Fever, unspecified: Secondary | ICD-10-CM | POA: Diagnosis not present

## 2017-02-26 DIAGNOSIS — M47816 Spondylosis without myelopathy or radiculopathy, lumbar region: Secondary | ICD-10-CM | POA: Diagnosis not present

## 2017-02-26 DIAGNOSIS — G5603 Carpal tunnel syndrome, bilateral upper limbs: Secondary | ICD-10-CM | POA: Diagnosis not present

## 2017-03-09 DIAGNOSIS — J449 Chronic obstructive pulmonary disease, unspecified: Secondary | ICD-10-CM | POA: Diagnosis not present

## 2017-03-09 DIAGNOSIS — Z79899 Other long term (current) drug therapy: Secondary | ICD-10-CM | POA: Diagnosis not present

## 2017-03-09 DIAGNOSIS — Z981 Arthrodesis status: Secondary | ICD-10-CM | POA: Diagnosis not present

## 2017-03-09 DIAGNOSIS — Z880 Allergy status to penicillin: Secondary | ICD-10-CM | POA: Diagnosis not present

## 2017-03-09 DIAGNOSIS — Z836 Family history of other diseases of the respiratory system: Secondary | ICD-10-CM | POA: Diagnosis not present

## 2017-03-09 DIAGNOSIS — G5601 Carpal tunnel syndrome, right upper limb: Secondary | ICD-10-CM | POA: Diagnosis not present

## 2017-03-09 DIAGNOSIS — Z809 Family history of malignant neoplasm, unspecified: Secondary | ICD-10-CM | POA: Diagnosis not present

## 2017-03-09 DIAGNOSIS — Z9851 Tubal ligation status: Secondary | ICD-10-CM | POA: Diagnosis not present

## 2017-04-27 DIAGNOSIS — Z6833 Body mass index (BMI) 33.0-33.9, adult: Secondary | ICD-10-CM | POA: Diagnosis not present

## 2017-04-27 DIAGNOSIS — E782 Mixed hyperlipidemia: Secondary | ICD-10-CM | POA: Diagnosis not present

## 2017-04-27 DIAGNOSIS — E7800 Pure hypercholesterolemia, unspecified: Secondary | ICD-10-CM | POA: Diagnosis not present

## 2017-04-27 DIAGNOSIS — H109 Unspecified conjunctivitis: Secondary | ICD-10-CM | POA: Diagnosis not present

## 2017-04-27 DIAGNOSIS — F411 Generalized anxiety disorder: Secondary | ICD-10-CM | POA: Diagnosis not present

## 2017-04-28 DIAGNOSIS — Z780 Asymptomatic menopausal state: Secondary | ICD-10-CM | POA: Diagnosis not present

## 2017-04-28 DIAGNOSIS — Z79899 Other long term (current) drug therapy: Secondary | ICD-10-CM | POA: Diagnosis not present

## 2017-04-28 DIAGNOSIS — F419 Anxiety disorder, unspecified: Secondary | ICD-10-CM | POA: Diagnosis not present

## 2017-04-28 DIAGNOSIS — Z880 Allergy status to penicillin: Secondary | ICD-10-CM | POA: Diagnosis not present

## 2017-04-28 DIAGNOSIS — M4316 Spondylolisthesis, lumbar region: Secondary | ICD-10-CM | POA: Diagnosis not present

## 2017-04-28 DIAGNOSIS — M545 Low back pain: Secondary | ICD-10-CM | POA: Diagnosis not present

## 2017-04-28 DIAGNOSIS — Z809 Family history of malignant neoplasm, unspecified: Secondary | ICD-10-CM | POA: Diagnosis not present

## 2017-04-28 DIAGNOSIS — R112 Nausea with vomiting, unspecified: Secondary | ICD-10-CM | POA: Diagnosis not present

## 2017-04-28 DIAGNOSIS — J45909 Unspecified asthma, uncomplicated: Secondary | ICD-10-CM | POA: Diagnosis not present

## 2017-04-28 DIAGNOSIS — Z981 Arthrodesis status: Secondary | ICD-10-CM | POA: Diagnosis not present

## 2017-04-28 DIAGNOSIS — Z836 Family history of other diseases of the respiratory system: Secondary | ICD-10-CM | POA: Diagnosis not present

## 2017-04-28 DIAGNOSIS — E7800 Pure hypercholesterolemia, unspecified: Secondary | ICD-10-CM | POA: Diagnosis not present

## 2017-04-30 DIAGNOSIS — M4716 Other spondylosis with myelopathy, lumbar region: Secondary | ICD-10-CM | POA: Diagnosis not present

## 2017-04-30 DIAGNOSIS — M4316 Spondylolisthesis, lumbar region: Secondary | ICD-10-CM | POA: Diagnosis not present

## 2017-04-30 DIAGNOSIS — R112 Nausea with vomiting, unspecified: Secondary | ICD-10-CM | POA: Diagnosis not present

## 2017-04-30 DIAGNOSIS — M545 Low back pain: Secondary | ICD-10-CM | POA: Diagnosis not present

## 2017-04-30 DIAGNOSIS — Z780 Asymptomatic menopausal state: Secondary | ICD-10-CM | POA: Diagnosis not present

## 2017-04-30 DIAGNOSIS — Z981 Arthrodesis status: Secondary | ICD-10-CM | POA: Diagnosis not present

## 2017-04-30 DIAGNOSIS — J45909 Unspecified asthma, uncomplicated: Secondary | ICD-10-CM | POA: Diagnosis not present

## 2017-04-30 DIAGNOSIS — G894 Chronic pain syndrome: Secondary | ICD-10-CM | POA: Diagnosis not present

## 2017-05-01 DIAGNOSIS — Z780 Asymptomatic menopausal state: Secondary | ICD-10-CM | POA: Diagnosis not present

## 2017-05-01 DIAGNOSIS — M545 Low back pain: Secondary | ICD-10-CM | POA: Diagnosis not present

## 2017-05-01 DIAGNOSIS — M4316 Spondylolisthesis, lumbar region: Secondary | ICD-10-CM | POA: Diagnosis not present

## 2017-05-01 DIAGNOSIS — J45909 Unspecified asthma, uncomplicated: Secondary | ICD-10-CM | POA: Diagnosis not present

## 2017-05-01 DIAGNOSIS — R112 Nausea with vomiting, unspecified: Secondary | ICD-10-CM | POA: Diagnosis not present

## 2017-05-01 DIAGNOSIS — Z981 Arthrodesis status: Secondary | ICD-10-CM | POA: Diagnosis not present

## 2017-05-02 DIAGNOSIS — Z981 Arthrodesis status: Secondary | ICD-10-CM | POA: Diagnosis not present

## 2017-05-02 DIAGNOSIS — J45909 Unspecified asthma, uncomplicated: Secondary | ICD-10-CM | POA: Diagnosis not present

## 2017-05-02 DIAGNOSIS — M545 Low back pain: Secondary | ICD-10-CM | POA: Diagnosis not present

## 2017-05-02 DIAGNOSIS — M4316 Spondylolisthesis, lumbar region: Secondary | ICD-10-CM | POA: Diagnosis not present

## 2017-05-02 DIAGNOSIS — R112 Nausea with vomiting, unspecified: Secondary | ICD-10-CM | POA: Diagnosis not present

## 2017-05-02 DIAGNOSIS — Z780 Asymptomatic menopausal state: Secondary | ICD-10-CM | POA: Diagnosis not present

## 2017-05-03 DIAGNOSIS — M4316 Spondylolisthesis, lumbar region: Secondary | ICD-10-CM | POA: Diagnosis not present

## 2017-05-03 DIAGNOSIS — M545 Low back pain: Secondary | ICD-10-CM | POA: Diagnosis not present

## 2017-05-03 DIAGNOSIS — J45909 Unspecified asthma, uncomplicated: Secondary | ICD-10-CM | POA: Diagnosis not present

## 2017-05-03 DIAGNOSIS — Z780 Asymptomatic menopausal state: Secondary | ICD-10-CM | POA: Diagnosis not present

## 2017-05-03 DIAGNOSIS — Z981 Arthrodesis status: Secondary | ICD-10-CM | POA: Diagnosis not present

## 2017-05-03 DIAGNOSIS — R112 Nausea with vomiting, unspecified: Secondary | ICD-10-CM | POA: Diagnosis not present

## 2017-05-11 DIAGNOSIS — F5101 Primary insomnia: Secondary | ICD-10-CM | POA: Diagnosis not present

## 2017-05-11 DIAGNOSIS — F411 Generalized anxiety disorder: Secondary | ICD-10-CM | POA: Diagnosis not present

## 2017-05-11 DIAGNOSIS — G43909 Migraine, unspecified, not intractable, without status migrainosus: Secondary | ICD-10-CM | POA: Diagnosis not present

## 2017-05-11 DIAGNOSIS — Z6833 Body mass index (BMI) 33.0-33.9, adult: Secondary | ICD-10-CM | POA: Diagnosis not present

## 2017-05-11 DIAGNOSIS — E782 Mixed hyperlipidemia: Secondary | ICD-10-CM | POA: Diagnosis not present

## 2017-05-28 DIAGNOSIS — Z6832 Body mass index (BMI) 32.0-32.9, adult: Secondary | ICD-10-CM | POA: Diagnosis not present

## 2017-05-28 DIAGNOSIS — J0101 Acute recurrent maxillary sinusitis: Secondary | ICD-10-CM | POA: Diagnosis not present

## 2017-07-29 DIAGNOSIS — G5603 Carpal tunnel syndrome, bilateral upper limbs: Secondary | ICD-10-CM | POA: Diagnosis not present

## 2017-08-06 DIAGNOSIS — F411 Generalized anxiety disorder: Secondary | ICD-10-CM | POA: Diagnosis not present

## 2017-08-06 DIAGNOSIS — E782 Mixed hyperlipidemia: Secondary | ICD-10-CM | POA: Diagnosis not present

## 2017-08-06 DIAGNOSIS — E7800 Pure hypercholesterolemia, unspecified: Secondary | ICD-10-CM | POA: Diagnosis not present

## 2017-08-11 DIAGNOSIS — F411 Generalized anxiety disorder: Secondary | ICD-10-CM | POA: Diagnosis not present

## 2017-08-11 DIAGNOSIS — Z6832 Body mass index (BMI) 32.0-32.9, adult: Secondary | ICD-10-CM | POA: Diagnosis not present

## 2017-08-11 DIAGNOSIS — E782 Mixed hyperlipidemia: Secondary | ICD-10-CM | POA: Diagnosis not present

## 2017-08-31 DIAGNOSIS — Z6834 Body mass index (BMI) 34.0-34.9, adult: Secondary | ICD-10-CM | POA: Diagnosis not present

## 2017-08-31 DIAGNOSIS — J0101 Acute recurrent maxillary sinusitis: Secondary | ICD-10-CM | POA: Diagnosis not present

## 2017-08-31 DIAGNOSIS — J111 Influenza due to unidentified influenza virus with other respiratory manifestations: Secondary | ICD-10-CM | POA: Diagnosis not present

## 2017-09-18 DIAGNOSIS — S3992XA Unspecified injury of lower back, initial encounter: Secondary | ICD-10-CM | POA: Diagnosis not present

## 2017-09-18 DIAGNOSIS — M533 Sacrococcygeal disorders, not elsewhere classified: Secondary | ICD-10-CM | POA: Diagnosis not present

## 2017-09-19 DIAGNOSIS — Z87891 Personal history of nicotine dependence: Secondary | ICD-10-CM | POA: Diagnosis not present

## 2017-09-19 DIAGNOSIS — W268XXA Contact with other sharp object(s), not elsewhere classified, initial encounter: Secondary | ICD-10-CM | POA: Diagnosis not present

## 2017-09-19 DIAGNOSIS — S61411A Laceration without foreign body of right hand, initial encounter: Secondary | ICD-10-CM | POA: Diagnosis not present

## 2017-09-19 DIAGNOSIS — Z79899 Other long term (current) drug therapy: Secondary | ICD-10-CM | POA: Diagnosis not present

## 2017-09-19 DIAGNOSIS — Z23 Encounter for immunization: Secondary | ICD-10-CM | POA: Diagnosis not present

## 2017-09-26 DIAGNOSIS — Z6833 Body mass index (BMI) 33.0-33.9, adult: Secondary | ICD-10-CM | POA: Diagnosis not present

## 2017-09-26 DIAGNOSIS — L03113 Cellulitis of right upper limb: Secondary | ICD-10-CM | POA: Diagnosis not present

## 2017-09-28 DIAGNOSIS — L03113 Cellulitis of right upper limb: Secondary | ICD-10-CM | POA: Diagnosis not present

## 2017-09-28 DIAGNOSIS — Z6833 Body mass index (BMI) 33.0-33.9, adult: Secondary | ICD-10-CM | POA: Diagnosis not present

## 2017-10-07 DIAGNOSIS — M47816 Spondylosis without myelopathy or radiculopathy, lumbar region: Secondary | ICD-10-CM | POA: Diagnosis not present

## 2017-10-07 DIAGNOSIS — G5603 Carpal tunnel syndrome, bilateral upper limbs: Secondary | ICD-10-CM | POA: Diagnosis not present

## 2017-11-06 DIAGNOSIS — Z0001 Encounter for general adult medical examination with abnormal findings: Secondary | ICD-10-CM | POA: Diagnosis not present

## 2017-11-06 DIAGNOSIS — E782 Mixed hyperlipidemia: Secondary | ICD-10-CM | POA: Diagnosis not present

## 2017-11-06 DIAGNOSIS — F411 Generalized anxiety disorder: Secondary | ICD-10-CM | POA: Diagnosis not present

## 2017-11-06 DIAGNOSIS — Z6832 Body mass index (BMI) 32.0-32.9, adult: Secondary | ICD-10-CM | POA: Diagnosis not present

## 2017-11-06 DIAGNOSIS — R5383 Other fatigue: Secondary | ICD-10-CM | POA: Diagnosis not present

## 2017-11-06 DIAGNOSIS — E7800 Pure hypercholesterolemia, unspecified: Secondary | ICD-10-CM | POA: Diagnosis not present

## 2017-11-06 DIAGNOSIS — F959 Tic disorder, unspecified: Secondary | ICD-10-CM | POA: Diagnosis not present

## 2017-11-06 DIAGNOSIS — J029 Acute pharyngitis, unspecified: Secondary | ICD-10-CM | POA: Diagnosis not present

## 2017-11-11 DIAGNOSIS — Z6832 Body mass index (BMI) 32.0-32.9, adult: Secondary | ICD-10-CM | POA: Diagnosis not present

## 2017-11-11 DIAGNOSIS — F411 Generalized anxiety disorder: Secondary | ICD-10-CM | POA: Diagnosis not present

## 2017-11-11 DIAGNOSIS — E782 Mixed hyperlipidemia: Secondary | ICD-10-CM | POA: Diagnosis not present

## 2017-11-17 DIAGNOSIS — M533 Sacrococcygeal disorders, not elsewhere classified: Secondary | ICD-10-CM | POA: Diagnosis not present

## 2017-11-17 DIAGNOSIS — M47816 Spondylosis without myelopathy or radiculopathy, lumbar region: Secondary | ICD-10-CM | POA: Diagnosis not present

## 2017-12-05 DIAGNOSIS — G4761 Periodic limb movement disorder: Secondary | ICD-10-CM | POA: Diagnosis not present

## 2017-12-05 DIAGNOSIS — G4733 Obstructive sleep apnea (adult) (pediatric): Secondary | ICD-10-CM | POA: Diagnosis not present

## 2017-12-05 DIAGNOSIS — R5383 Other fatigue: Secondary | ICD-10-CM | POA: Diagnosis not present

## 2017-12-23 DIAGNOSIS — G4739 Other sleep apnea: Secondary | ICD-10-CM | POA: Diagnosis not present

## 2017-12-24 DIAGNOSIS — G4761 Periodic limb movement disorder: Secondary | ICD-10-CM | POA: Diagnosis not present

## 2017-12-24 DIAGNOSIS — G4733 Obstructive sleep apnea (adult) (pediatric): Secondary | ICD-10-CM | POA: Diagnosis not present

## 2018-02-02 DIAGNOSIS — E7800 Pure hypercholesterolemia, unspecified: Secondary | ICD-10-CM | POA: Diagnosis not present

## 2018-02-02 DIAGNOSIS — E782 Mixed hyperlipidemia: Secondary | ICD-10-CM | POA: Diagnosis not present

## 2018-02-02 DIAGNOSIS — R5383 Other fatigue: Secondary | ICD-10-CM | POA: Diagnosis not present

## 2018-02-03 DIAGNOSIS — N950 Postmenopausal bleeding: Secondary | ICD-10-CM | POA: Diagnosis not present

## 2018-02-03 DIAGNOSIS — N858 Other specified noninflammatory disorders of uterus: Secondary | ICD-10-CM | POA: Diagnosis not present

## 2018-02-04 DIAGNOSIS — E782 Mixed hyperlipidemia: Secondary | ICD-10-CM | POA: Diagnosis not present

## 2018-02-04 DIAGNOSIS — F411 Generalized anxiety disorder: Secondary | ICD-10-CM | POA: Diagnosis not present

## 2018-02-04 DIAGNOSIS — Z6831 Body mass index (BMI) 31.0-31.9, adult: Secondary | ICD-10-CM | POA: Diagnosis not present

## 2018-02-04 DIAGNOSIS — M545 Low back pain: Secondary | ICD-10-CM | POA: Diagnosis not present

## 2018-02-12 DIAGNOSIS — M47816 Spondylosis without myelopathy or radiculopathy, lumbar region: Secondary | ICD-10-CM | POA: Diagnosis not present

## 2018-02-12 DIAGNOSIS — M533 Sacrococcygeal disorders, not elsewhere classified: Secondary | ICD-10-CM | POA: Diagnosis not present

## 2018-02-15 DIAGNOSIS — Z01419 Encounter for gynecological examination (general) (routine) without abnormal findings: Secondary | ICD-10-CM | POA: Diagnosis not present

## 2018-02-15 DIAGNOSIS — Z124 Encounter for screening for malignant neoplasm of cervix: Secondary | ICD-10-CM | POA: Diagnosis not present

## 2018-04-14 DIAGNOSIS — R635 Abnormal weight gain: Secondary | ICD-10-CM | POA: Diagnosis not present

## 2018-05-03 DIAGNOSIS — K529 Noninfective gastroenteritis and colitis, unspecified: Secondary | ICD-10-CM | POA: Diagnosis not present

## 2018-05-03 DIAGNOSIS — E785 Hyperlipidemia, unspecified: Secondary | ICD-10-CM | POA: Diagnosis not present

## 2018-05-03 DIAGNOSIS — R1084 Generalized abdominal pain: Secondary | ICD-10-CM | POA: Diagnosis not present

## 2018-05-03 DIAGNOSIS — R197 Diarrhea, unspecified: Secondary | ICD-10-CM | POA: Diagnosis not present

## 2018-05-03 DIAGNOSIS — Z79899 Other long term (current) drug therapy: Secondary | ICD-10-CM | POA: Diagnosis not present

## 2018-05-04 DIAGNOSIS — Z6829 Body mass index (BMI) 29.0-29.9, adult: Secondary | ICD-10-CM | POA: Diagnosis not present

## 2018-05-04 DIAGNOSIS — R197 Diarrhea, unspecified: Secondary | ICD-10-CM | POA: Diagnosis not present

## 2018-05-04 DIAGNOSIS — R1084 Generalized abdominal pain: Secondary | ICD-10-CM | POA: Diagnosis not present

## 2018-05-25 DIAGNOSIS — M47816 Spondylosis without myelopathy or radiculopathy, lumbar region: Secondary | ICD-10-CM | POA: Diagnosis not present

## 2018-06-02 DIAGNOSIS — E782 Mixed hyperlipidemia: Secondary | ICD-10-CM | POA: Diagnosis not present

## 2018-06-02 DIAGNOSIS — R5383 Other fatigue: Secondary | ICD-10-CM | POA: Diagnosis not present

## 2018-06-02 DIAGNOSIS — E7800 Pure hypercholesterolemia, unspecified: Secondary | ICD-10-CM | POA: Diagnosis not present

## 2018-06-10 DIAGNOSIS — M545 Low back pain: Secondary | ICD-10-CM | POA: Diagnosis not present

## 2018-06-10 DIAGNOSIS — E782 Mixed hyperlipidemia: Secondary | ICD-10-CM | POA: Diagnosis not present

## 2018-06-10 DIAGNOSIS — F411 Generalized anxiety disorder: Secondary | ICD-10-CM | POA: Diagnosis not present

## 2018-06-23 DIAGNOSIS — G5603 Carpal tunnel syndrome, bilateral upper limbs: Secondary | ICD-10-CM | POA: Diagnosis not present

## 2018-07-20 DIAGNOSIS — Z9851 Tubal ligation status: Secondary | ICD-10-CM | POA: Diagnosis not present

## 2018-07-20 DIAGNOSIS — Z87891 Personal history of nicotine dependence: Secondary | ICD-10-CM | POA: Diagnosis not present

## 2018-07-20 DIAGNOSIS — J449 Chronic obstructive pulmonary disease, unspecified: Secondary | ICD-10-CM | POA: Diagnosis not present

## 2018-07-20 DIAGNOSIS — G5602 Carpal tunnel syndrome, left upper limb: Secondary | ICD-10-CM | POA: Diagnosis not present

## 2018-07-20 DIAGNOSIS — Z79899 Other long term (current) drug therapy: Secondary | ICD-10-CM | POA: Diagnosis not present

## 2018-07-20 DIAGNOSIS — Z791 Long term (current) use of non-steroidal anti-inflammatories (NSAID): Secondary | ICD-10-CM | POA: Diagnosis not present

## 2018-07-20 DIAGNOSIS — F329 Major depressive disorder, single episode, unspecified: Secondary | ICD-10-CM | POA: Diagnosis not present

## 2018-07-20 DIAGNOSIS — Z981 Arthrodesis status: Secondary | ICD-10-CM | POA: Diagnosis not present

## 2018-07-20 DIAGNOSIS — Z836 Family history of other diseases of the respiratory system: Secondary | ICD-10-CM | POA: Diagnosis not present

## 2018-07-20 DIAGNOSIS — Z880 Allergy status to penicillin: Secondary | ICD-10-CM | POA: Diagnosis not present

## 2018-07-20 DIAGNOSIS — Z809 Family history of malignant neoplasm, unspecified: Secondary | ICD-10-CM | POA: Diagnosis not present

## 2018-07-20 DIAGNOSIS — G43909 Migraine, unspecified, not intractable, without status migrainosus: Secondary | ICD-10-CM | POA: Diagnosis not present

## 2018-07-20 DIAGNOSIS — R918 Other nonspecific abnormal finding of lung field: Secondary | ICD-10-CM | POA: Diagnosis not present

## 2018-07-22 DIAGNOSIS — Z791 Long term (current) use of non-steroidal anti-inflammatories (NSAID): Secondary | ICD-10-CM | POA: Diagnosis not present

## 2018-07-22 DIAGNOSIS — G43909 Migraine, unspecified, not intractable, without status migrainosus: Secondary | ICD-10-CM | POA: Diagnosis not present

## 2018-07-22 DIAGNOSIS — Z79899 Other long term (current) drug therapy: Secondary | ICD-10-CM | POA: Diagnosis not present

## 2018-07-22 DIAGNOSIS — J449 Chronic obstructive pulmonary disease, unspecified: Secondary | ICD-10-CM | POA: Diagnosis not present

## 2018-07-22 DIAGNOSIS — G5602 Carpal tunnel syndrome, left upper limb: Secondary | ICD-10-CM | POA: Diagnosis not present

## 2018-07-22 DIAGNOSIS — F329 Major depressive disorder, single episode, unspecified: Secondary | ICD-10-CM | POA: Diagnosis not present

## 2018-08-06 DIAGNOSIS — Z6828 Body mass index (BMI) 28.0-28.9, adult: Secondary | ICD-10-CM | POA: Diagnosis not present

## 2018-08-06 DIAGNOSIS — K645 Perianal venous thrombosis: Secondary | ICD-10-CM | POA: Diagnosis not present

## 2018-08-06 DIAGNOSIS — R634 Abnormal weight loss: Secondary | ICD-10-CM | POA: Diagnosis not present

## 2018-08-16 DIAGNOSIS — Z1231 Encounter for screening mammogram for malignant neoplasm of breast: Secondary | ICD-10-CM | POA: Diagnosis not present

## 2018-09-03 DIAGNOSIS — G4730 Sleep apnea, unspecified: Secondary | ICD-10-CM | POA: Diagnosis not present

## 2018-09-03 DIAGNOSIS — R5383 Other fatigue: Secondary | ICD-10-CM | POA: Diagnosis not present

## 2018-09-03 DIAGNOSIS — F411 Generalized anxiety disorder: Secondary | ICD-10-CM | POA: Diagnosis not present

## 2018-09-03 DIAGNOSIS — R634 Abnormal weight loss: Secondary | ICD-10-CM | POA: Diagnosis not present

## 2018-09-03 DIAGNOSIS — E782 Mixed hyperlipidemia: Secondary | ICD-10-CM | POA: Diagnosis not present

## 2018-09-07 DIAGNOSIS — M545 Low back pain: Secondary | ICD-10-CM | POA: Diagnosis not present

## 2018-09-07 DIAGNOSIS — Z6828 Body mass index (BMI) 28.0-28.9, adult: Secondary | ICD-10-CM | POA: Diagnosis not present

## 2018-09-07 DIAGNOSIS — E782 Mixed hyperlipidemia: Secondary | ICD-10-CM | POA: Diagnosis not present

## 2018-09-07 DIAGNOSIS — F411 Generalized anxiety disorder: Secondary | ICD-10-CM | POA: Diagnosis not present

## 2018-10-12 DIAGNOSIS — J209 Acute bronchitis, unspecified: Secondary | ICD-10-CM | POA: Diagnosis not present

## 2018-10-12 DIAGNOSIS — R05 Cough: Secondary | ICD-10-CM | POA: Diagnosis not present

## 2018-10-12 DIAGNOSIS — R0602 Shortness of breath: Secondary | ICD-10-CM | POA: Diagnosis not present

## 2018-10-12 DIAGNOSIS — R0989 Other specified symptoms and signs involving the circulatory and respiratory systems: Secondary | ICD-10-CM | POA: Diagnosis not present

## 2018-10-18 ENCOUNTER — Telehealth: Payer: Self-pay

## 2018-10-18 DIAGNOSIS — J209 Acute bronchitis, unspecified: Secondary | ICD-10-CM | POA: Diagnosis not present

## 2018-10-18 DIAGNOSIS — Z6829 Body mass index (BMI) 29.0-29.9, adult: Secondary | ICD-10-CM | POA: Diagnosis not present

## 2018-10-18 DIAGNOSIS — R0602 Shortness of breath: Secondary | ICD-10-CM | POA: Diagnosis not present

## 2018-10-18 NOTE — Telephone Encounter (Signed)
Received request for medical records from [facility]. Called them and let them know that she has never been seen by our practice. Nothing further needed.

## 2018-10-21 DIAGNOSIS — R05 Cough: Secondary | ICD-10-CM | POA: Diagnosis not present

## 2018-10-21 DIAGNOSIS — I700 Atherosclerosis of aorta: Secondary | ICD-10-CM | POA: Diagnosis not present

## 2018-10-21 DIAGNOSIS — R0602 Shortness of breath: Secondary | ICD-10-CM | POA: Diagnosis not present

## 2018-10-28 DIAGNOSIS — R0602 Shortness of breath: Secondary | ICD-10-CM | POA: Diagnosis not present

## 2018-10-28 DIAGNOSIS — R079 Chest pain, unspecified: Secondary | ICD-10-CM | POA: Diagnosis not present

## 2018-11-04 DIAGNOSIS — R109 Unspecified abdominal pain: Secondary | ICD-10-CM | POA: Diagnosis not present

## 2018-11-04 DIAGNOSIS — N3090 Cystitis, unspecified without hematuria: Secondary | ICD-10-CM | POA: Diagnosis not present

## 2018-11-04 DIAGNOSIS — E7800 Pure hypercholesterolemia, unspecified: Secondary | ICD-10-CM | POA: Diagnosis not present

## 2018-11-04 DIAGNOSIS — F17200 Nicotine dependence, unspecified, uncomplicated: Secondary | ICD-10-CM | POA: Diagnosis not present

## 2018-12-27 DIAGNOSIS — M47816 Spondylosis without myelopathy or radiculopathy, lumbar region: Secondary | ICD-10-CM | POA: Diagnosis not present

## 2019-01-04 DIAGNOSIS — F411 Generalized anxiety disorder: Secondary | ICD-10-CM | POA: Diagnosis not present

## 2019-01-04 DIAGNOSIS — E7800 Pure hypercholesterolemia, unspecified: Secondary | ICD-10-CM | POA: Diagnosis not present

## 2019-01-04 DIAGNOSIS — E782 Mixed hyperlipidemia: Secondary | ICD-10-CM | POA: Diagnosis not present

## 2019-01-04 DIAGNOSIS — R5383 Other fatigue: Secondary | ICD-10-CM | POA: Diagnosis not present

## 2019-01-07 DIAGNOSIS — M545 Low back pain: Secondary | ICD-10-CM | POA: Diagnosis not present

## 2019-01-07 DIAGNOSIS — Z720 Tobacco use: Secondary | ICD-10-CM | POA: Diagnosis not present

## 2019-01-07 DIAGNOSIS — Z0001 Encounter for general adult medical examination with abnormal findings: Secondary | ICD-10-CM | POA: Diagnosis not present

## 2019-01-07 DIAGNOSIS — E782 Mixed hyperlipidemia: Secondary | ICD-10-CM | POA: Diagnosis not present

## 2019-01-07 DIAGNOSIS — F411 Generalized anxiety disorder: Secondary | ICD-10-CM | POA: Diagnosis not present

## 2019-01-07 DIAGNOSIS — Z6829 Body mass index (BMI) 29.0-29.9, adult: Secondary | ICD-10-CM | POA: Diagnosis not present

## 2019-03-04 DIAGNOSIS — R5383 Other fatigue: Secondary | ICD-10-CM | POA: Diagnosis not present

## 2019-03-04 DIAGNOSIS — E7800 Pure hypercholesterolemia, unspecified: Secondary | ICD-10-CM | POA: Diagnosis not present

## 2019-04-11 DIAGNOSIS — E782 Mixed hyperlipidemia: Secondary | ICD-10-CM | POA: Diagnosis not present

## 2019-04-11 DIAGNOSIS — F329 Major depressive disorder, single episode, unspecified: Secondary | ICD-10-CM | POA: Diagnosis not present

## 2019-04-11 DIAGNOSIS — M545 Low back pain: Secondary | ICD-10-CM | POA: Diagnosis not present

## 2019-04-11 DIAGNOSIS — Z6829 Body mass index (BMI) 29.0-29.9, adult: Secondary | ICD-10-CM | POA: Diagnosis not present

## 2019-04-11 DIAGNOSIS — F411 Generalized anxiety disorder: Secondary | ICD-10-CM | POA: Diagnosis not present

## 2019-04-26 DIAGNOSIS — H6092 Unspecified otitis externa, left ear: Secondary | ICD-10-CM | POA: Diagnosis not present

## 2019-05-03 DIAGNOSIS — E782 Mixed hyperlipidemia: Secondary | ICD-10-CM | POA: Diagnosis not present

## 2019-05-03 DIAGNOSIS — Z6829 Body mass index (BMI) 29.0-29.9, adult: Secondary | ICD-10-CM | POA: Diagnosis not present

## 2019-05-03 DIAGNOSIS — F329 Major depressive disorder, single episode, unspecified: Secondary | ICD-10-CM | POA: Diagnosis not present

## 2019-05-03 DIAGNOSIS — M545 Low back pain: Secondary | ICD-10-CM | POA: Diagnosis not present

## 2019-05-03 DIAGNOSIS — F411 Generalized anxiety disorder: Secondary | ICD-10-CM | POA: Diagnosis not present

## 2019-07-20 DIAGNOSIS — M545 Low back pain: Secondary | ICD-10-CM | POA: Diagnosis not present

## 2019-07-20 DIAGNOSIS — M79604 Pain in right leg: Secondary | ICD-10-CM | POA: Diagnosis not present

## 2019-07-20 DIAGNOSIS — Z6830 Body mass index (BMI) 30.0-30.9, adult: Secondary | ICD-10-CM | POA: Diagnosis not present

## 2019-07-20 DIAGNOSIS — Z23 Encounter for immunization: Secondary | ICD-10-CM | POA: Diagnosis not present

## 2019-07-25 DIAGNOSIS — Z9889 Other specified postprocedural states: Secondary | ICD-10-CM | POA: Diagnosis not present

## 2019-07-25 DIAGNOSIS — M549 Dorsalgia, unspecified: Secondary | ICD-10-CM | POA: Diagnosis not present

## 2019-07-25 DIAGNOSIS — M545 Low back pain: Secondary | ICD-10-CM | POA: Diagnosis not present

## 2019-09-19 DIAGNOSIS — J069 Acute upper respiratory infection, unspecified: Secondary | ICD-10-CM | POA: Diagnosis not present

## 2019-10-13 DIAGNOSIS — M545 Low back pain: Secondary | ICD-10-CM | POA: Diagnosis not present

## 2019-10-13 DIAGNOSIS — F329 Major depressive disorder, single episode, unspecified: Secondary | ICD-10-CM | POA: Diagnosis not present

## 2019-10-13 DIAGNOSIS — Z6831 Body mass index (BMI) 31.0-31.9, adult: Secondary | ICD-10-CM | POA: Diagnosis not present

## 2019-10-13 DIAGNOSIS — E782 Mixed hyperlipidemia: Secondary | ICD-10-CM | POA: Diagnosis not present

## 2019-10-13 DIAGNOSIS — F411 Generalized anxiety disorder: Secondary | ICD-10-CM | POA: Diagnosis not present

## 2019-12-05 DIAGNOSIS — H2513 Age-related nuclear cataract, bilateral: Secondary | ICD-10-CM | POA: Diagnosis not present

## 2019-12-05 DIAGNOSIS — H40013 Open angle with borderline findings, low risk, bilateral: Secondary | ICD-10-CM | POA: Diagnosis not present

## 2020-01-10 DIAGNOSIS — R5383 Other fatigue: Secondary | ICD-10-CM | POA: Diagnosis not present

## 2020-01-10 DIAGNOSIS — Z720 Tobacco use: Secondary | ICD-10-CM | POA: Diagnosis not present

## 2020-01-10 DIAGNOSIS — E782 Mixed hyperlipidemia: Secondary | ICD-10-CM | POA: Diagnosis not present

## 2020-01-16 DIAGNOSIS — F411 Generalized anxiety disorder: Secondary | ICD-10-CM | POA: Diagnosis not present

## 2020-01-16 DIAGNOSIS — M545 Low back pain: Secondary | ICD-10-CM | POA: Diagnosis not present

## 2020-01-16 DIAGNOSIS — Z6832 Body mass index (BMI) 32.0-32.9, adult: Secondary | ICD-10-CM | POA: Diagnosis not present

## 2020-01-16 DIAGNOSIS — N200 Calculus of kidney: Secondary | ICD-10-CM | POA: Diagnosis not present

## 2020-01-16 DIAGNOSIS — F329 Major depressive disorder, single episode, unspecified: Secondary | ICD-10-CM | POA: Diagnosis not present

## 2020-01-16 DIAGNOSIS — Z1331 Encounter for screening for depression: Secondary | ICD-10-CM | POA: Diagnosis not present

## 2020-01-16 DIAGNOSIS — E782 Mixed hyperlipidemia: Secondary | ICD-10-CM | POA: Diagnosis not present

## 2020-01-16 DIAGNOSIS — Z1389 Encounter for screening for other disorder: Secondary | ICD-10-CM | POA: Diagnosis not present

## 2020-01-19 DIAGNOSIS — R300 Dysuria: Secondary | ICD-10-CM | POA: Diagnosis not present

## 2020-02-15 DIAGNOSIS — Z720 Tobacco use: Secondary | ICD-10-CM | POA: Diagnosis not present

## 2020-02-15 DIAGNOSIS — E7800 Pure hypercholesterolemia, unspecified: Secondary | ICD-10-CM | POA: Diagnosis not present

## 2020-04-16 DIAGNOSIS — Q600 Renal agenesis, unilateral: Secondary | ICD-10-CM | POA: Diagnosis not present

## 2020-05-29 DIAGNOSIS — B974 Respiratory syncytial virus as the cause of diseases classified elsewhere: Secondary | ICD-10-CM | POA: Diagnosis not present

## 2020-05-29 DIAGNOSIS — Z20828 Contact with and (suspected) exposure to other viral communicable diseases: Secondary | ICD-10-CM | POA: Diagnosis not present

## 2020-05-29 DIAGNOSIS — R05 Cough: Secondary | ICD-10-CM | POA: Diagnosis not present

## 2020-06-13 DIAGNOSIS — Z20828 Contact with and (suspected) exposure to other viral communicable diseases: Secondary | ICD-10-CM | POA: Diagnosis not present

## 2020-06-14 DIAGNOSIS — Z20828 Contact with and (suspected) exposure to other viral communicable diseases: Secondary | ICD-10-CM | POA: Diagnosis not present

## 2020-06-14 DIAGNOSIS — J209 Acute bronchitis, unspecified: Secondary | ICD-10-CM | POA: Diagnosis not present

## 2020-06-14 DIAGNOSIS — R05 Cough: Secondary | ICD-10-CM | POA: Diagnosis not present

## 2020-07-10 DIAGNOSIS — E782 Mixed hyperlipidemia: Secondary | ICD-10-CM | POA: Diagnosis not present

## 2020-07-10 DIAGNOSIS — Z720 Tobacco use: Secondary | ICD-10-CM | POA: Diagnosis not present

## 2020-07-10 DIAGNOSIS — E7800 Pure hypercholesterolemia, unspecified: Secondary | ICD-10-CM | POA: Diagnosis not present

## 2020-07-10 DIAGNOSIS — R5383 Other fatigue: Secondary | ICD-10-CM | POA: Diagnosis not present

## 2020-07-10 DIAGNOSIS — Z20822 Contact with and (suspected) exposure to covid-19: Secondary | ICD-10-CM | POA: Diagnosis not present

## 2020-07-13 DIAGNOSIS — Z23 Encounter for immunization: Secondary | ICD-10-CM | POA: Diagnosis not present

## 2020-07-13 DIAGNOSIS — E782 Mixed hyperlipidemia: Secondary | ICD-10-CM | POA: Diagnosis not present

## 2020-07-13 DIAGNOSIS — F329 Major depressive disorder, single episode, unspecified: Secondary | ICD-10-CM | POA: Diagnosis not present

## 2020-07-13 DIAGNOSIS — Z0001 Encounter for general adult medical examination with abnormal findings: Secondary | ICD-10-CM | POA: Diagnosis not present

## 2020-07-13 DIAGNOSIS — Z6831 Body mass index (BMI) 31.0-31.9, adult: Secondary | ICD-10-CM | POA: Diagnosis not present

## 2020-07-13 DIAGNOSIS — F411 Generalized anxiety disorder: Secondary | ICD-10-CM | POA: Diagnosis not present

## 2020-07-13 DIAGNOSIS — R300 Dysuria: Secondary | ICD-10-CM | POA: Diagnosis not present

## 2020-08-06 DIAGNOSIS — Z20828 Contact with and (suspected) exposure to other viral communicable diseases: Secondary | ICD-10-CM | POA: Diagnosis not present

## 2020-10-02 DIAGNOSIS — R5383 Other fatigue: Secondary | ICD-10-CM | POA: Diagnosis not present

## 2020-10-02 DIAGNOSIS — Z20828 Contact with and (suspected) exposure to other viral communicable diseases: Secondary | ICD-10-CM | POA: Diagnosis not present

## 2020-10-02 DIAGNOSIS — R197 Diarrhea, unspecified: Secondary | ICD-10-CM | POA: Diagnosis not present

## 2020-10-16 DIAGNOSIS — Z20822 Contact with and (suspected) exposure to covid-19: Secondary | ICD-10-CM | POA: Diagnosis not present

## 2020-10-30 DIAGNOSIS — Z1231 Encounter for screening mammogram for malignant neoplasm of breast: Secondary | ICD-10-CM | POA: Diagnosis not present

## 2020-11-06 DIAGNOSIS — R1013 Epigastric pain: Secondary | ICD-10-CM | POA: Diagnosis not present

## 2020-11-06 DIAGNOSIS — K219 Gastro-esophageal reflux disease without esophagitis: Secondary | ICD-10-CM | POA: Diagnosis not present

## 2020-11-06 DIAGNOSIS — Z6833 Body mass index (BMI) 33.0-33.9, adult: Secondary | ICD-10-CM | POA: Diagnosis not present

## 2020-12-06 ENCOUNTER — Encounter: Payer: Self-pay | Admitting: Gastroenterology

## 2020-12-20 ENCOUNTER — Ambulatory Visit: Payer: Self-pay | Admitting: Gastroenterology

## 2021-01-07 DIAGNOSIS — E7800 Pure hypercholesterolemia, unspecified: Secondary | ICD-10-CM | POA: Diagnosis not present

## 2021-01-07 DIAGNOSIS — K219 Gastro-esophageal reflux disease without esophagitis: Secondary | ICD-10-CM | POA: Diagnosis not present

## 2021-01-07 DIAGNOSIS — E782 Mixed hyperlipidemia: Secondary | ICD-10-CM | POA: Diagnosis not present

## 2021-01-07 DIAGNOSIS — E7849 Other hyperlipidemia: Secondary | ICD-10-CM | POA: Diagnosis not present

## 2021-01-07 DIAGNOSIS — Z1329 Encounter for screening for other suspected endocrine disorder: Secondary | ICD-10-CM | POA: Diagnosis not present

## 2021-01-09 ENCOUNTER — Encounter: Payer: Self-pay | Admitting: Gastroenterology

## 2021-01-09 ENCOUNTER — Ambulatory Visit: Payer: Medicare Other | Admitting: Gastroenterology

## 2021-01-09 VITALS — BP 106/60 | HR 84 | Ht 62.5 in | Wt 183.4 lb

## 2021-01-09 DIAGNOSIS — K219 Gastro-esophageal reflux disease without esophagitis: Secondary | ICD-10-CM | POA: Diagnosis not present

## 2021-01-09 DIAGNOSIS — R1013 Epigastric pain: Secondary | ICD-10-CM

## 2021-01-09 DIAGNOSIS — R112 Nausea with vomiting, unspecified: Secondary | ICD-10-CM

## 2021-01-09 NOTE — Patient Instructions (Signed)
If you are age 56 or older, your body mass index should be between 23-30. Your Body mass index is 33.01 kg/m. If this is out of the aforementioned range listed, please consider follow up with your Primary Care Provider.  If you are age 21 or younger, your body mass index should be between 19-25. Your Body mass index is 33.01 kg/m. If this is out of the aformentioned range listed, please consider follow up with your Primary Care Provider.   You have been scheduled for a colonoscopy. Please follow written instructions given to you at your visit today.  Please pick up your prep supplies at the pharmacy within the next 1-3 days. If you use inhalers (even only as needed), please bring them with you on the day of your procedure.  Due to recent changes in healthcare laws, you may see the results of your imaging and laboratory studies on MyChart before your provider has had a chance to review them.  We understand that in some cases there may be results that are confusing or concerning to you. Not all laboratory results come back in the same time frame and the provider may be waiting for multiple results in order to interpret others.  Please give [place] 48 hours in order for your provider to thoroughly review all the results before contacting the office for clarification of your results.   You have been scheduled for a HIDA scan at [facility]  on 01-16-2021. Please arrive 15 minutes prior to your scheduled appointment at  8am. Make certain not to have anything to eat or drink at least 6 hours prior to your test. Should this appointment date or time not work well for you, please call radiology scheduling at [phone]. NO PAIN medications that morning until after the HIDA has ben completed. _____________________________________________________________________ hepatobiliary (HIDA) scan is an imaging procedure used to diagnose problems in the liver, gallbladder and bile ducts. In the HIDA scan, a radioactive chemical  or tracer is injected into a vein in your arm. The tracer is handled by the liver like bile. Bile is a fluid produced and excreted by your liver that helps your digestive system break down fats in the foods you eat. Bile is stored in your gallbladder and the gallbladder releases the bile when you eat a meal. A special nuclear medicine scanner (gamma camera) tracks the flow of the tracer from your liver into your gallbladder and small intestine.  During your HIDA scan  You'll be asked to change into a hospital gown before your HIDA scan begins. Your health care team will position you on a table, usually on your back. The radioactive tracer is then injected into a vein in your arm.The tracer travels through your bloodstream to your liver, where it's taken up by the bile-producing cells. The radioactive tracer travels with the bile from your liver into your gallbladder and through your bile ducts to your small intestine.You may feel some pressure while the radioactive tracer is injected into your vein. As you lie on the table, a special gamma camera is positioned over your abdomen taking pictures of the tracer as it moves through your body. The gamma camera takes pictures continually for about an hour. You'll need to keep still during the HIDA scan. This can become uncomfortable, but you may find that you can lessen the discomfort by taking deep breaths and thinking about other things. Tell your health care team if you're uncomfortable. The radiologist will watch on a computer the progress  of the radioactive tracer through your body. The HIDA scan may be stopped when the radioactive tracer is seen in the gallbladder and enters your small intestine. This typically takes about an hour. In some cases extra imaging will be performed if original images aren't satisfactory, if morphine is given to help visualize the gallbladder or if the medication CCK is given to look at the contraction of the gallbladder. This test  typically takes 2 hours to complete. ________________________________________________________________________

## 2021-01-09 NOTE — Progress Notes (Signed)
01/09/2021 Yesenia Salinas [id number] September 25, 1966   HISTORY OF PRESENT ILLNESS: This is a 55 year old female who is new to our office.  She has been referred here by her PCP, Lianne Moris, PA-C, for evaluation regarding epigastric abdominal pain.  The patient tells me that her symptoms have been present for about the past 6 or 7 month.  She says that initially when she would eat something spicy she would get pain in her upper abdomen.  She says that she thought it was reflux so she stopped eating spicy foods, tomato based products, etc.  She says that her symptoms have progressed and now what ever she eats, even small amounts because her pain all up in her upper abdomen.  She says that about 2 times per week she has very severe episodes where she has to make herself vomit just to get relief of her symptoms.  She had an ultrasound performed that was unremarkable.  She had H. pylori antibody that was negative.  Lipase normal.  CMP unremarkable.  She has been on Nexium 40 mg twice daily and Carafate tablets 4 times a day.  Initially she thought the Carafate was helping, but now she is not so sure.  She says that when she has the severe episodes a couple times per week her pain is so severe that she just sits and cries.  She had a colonoscopy in December 2016 by Dr. Teena Dunk at [facility] that showed mild diverticulosis in the sigmoid colon, moderate size internal hemorrhoids, inflamed and hypertrophied anal papilla.  Recommended repeat was in 10 years.   Past Medical History:  Diagnosis Date  . Anxiety   . Asthma   . Depression   . GERD (gastroesophageal reflux disease)   . HLD (hyperlipidemia)   . Lumbar spondylosis   . RLS (restless legs syndrome)   . S/P PICC central line placement   . Sepsis (HCC)   . Sleep apnea    Past Surgical History:  Procedure Laterality Date  . APPENDECTOMY    . COLONOSCOPY  09/2015   Dr Teena Dunk   . ECTOPIC PREGNANCY SURGERY     fallopian tubes  removed  . SPINAL CAGE    . SPINAL CORD STIMULATOR INSERTION     x 2  . SPINAL FUSION Right    with partial hip surgery  . TONSILLECTOMY      reports that she has been smoking. She has never used smokeless tobacco. She reports current alcohol use. She reports that she does not use drugs. family history includes Autism in her grandson; Blindness in her brother; COPD in her father; Cervical cancer in her mother; Cleft lip in her granddaughter; Cleft palate in her granddaughter; Crohn's disease in her brother; Diabetes in her father, paternal grandmother, and paternal uncle; Heart failure in her father; Kidney disease in her cousin; Other in an other family member; Ovarian cancer in her mother; Pancreatic cancer in her brother; Uterine cancer in her mother. Allergies  Allergen Reactions  . Penicillin G Benzathine Hives      Outpatient Encounter Medications as of 01/09/2021  Medication Sig  . cyclobenzaprine (FLEXERIL) 10 MG tablet Take 1 tablet by mouth 3 (three) times daily.  . diazepam (VALIUM) 10 MG tablet Take 10 mg by mouth 3 (three) times daily.  Marland Kitchen esomeprazole (NEXIUM) 40 MG capsule Take 40 mg by mouth 2 (two) times daily.  Marland Kitchen ezetimibe (ZETIA) 10 MG tablet Take 10 mg by mouth daily.  . meloxicam (  MOBIC) 7.5 MG tablet Take 1 tablet by mouth as needed.  Marland Kitchen oxyCODONE-acetaminophen (PERCOCET) 10-325 MG tablet Take 1 tablet by mouth every 6 (six) hours as needed.  . promethazine (PHENERGAN) 25 MG tablet Take 1 tablet by mouth every 12 (twelve) hours as needed.  . simvastatin (ZOCOR) 40 MG tablet Take 40 mg by mouth daily.  . sucralfate (CARAFATE) 1 g tablet Take 1 g by mouth 4 (four) times daily as needed.   No facility-administered encounter medications on file as of 01/09/2021.     REVIEW OF SYSTEMS  : All other systems reviewed and negative except where noted in the History of Present Illness.   PHYSICAL EXAM: BP 106/60 (BP Location: Left Arm, Patient Position: Sitting, Cuff Size:  Normal)   Pulse 84   Ht 5' 2.5" (1.588 m) Comment: height measured without shoes  Wt 183 lb 6 oz (83.2 kg)   BMI 33.01 kg/m  General: Well developed white female in no acute distress Head: Normocephalic and atraumatic Eyes:  Sclerae anicteric, conjunctiva pink. Ears: Normal auditory acuity Lungs: Clear throughout to auscultation; no W/R/R. Heart: Regular rate and rhythm; no M/R/G. Abdomen: Soft, non-distended.  BS present.  Upper abdominal TTP. Musculoskeletal: Symmetrical with no gross deformities  Skin: No lesions on visible extremities Extremities: No edema  Neurological: Alert oriented x 4, grossly non-focal Psychological:  Alert and cooperative. Normal mood and affect  ASSESSMENT AND PLAN: *55 year old female with complaints of upper abdominal pain with vomiting after eating for the past several months.  The upper abdominal pain occurs after any time that she eats, even very small amounts, but she has severe episodes with the vomiting, etc. usually about twice a week.  Ultrasound was unremarkable.  Has been on Nexium 40 mg twice daily and Carafate tablets with minimal improvement in symptoms.  We will plan for EGD with Dr. Rhea Belton and then I think that HIDA scan might be a good idea as well.  Will rule out biliary dysfunction as a cause of her symptoms.  May need to consider cross-sectional imaging pending those results.  Also question if she has gastroparesis.  She is not diabetic, but is on longstanding pain medication.  The risks, benefits, and alternatives to EGD were discussed with the patient and she consents to proceed.  Continue current medications for now.  Advised small, frequent meals.  She tolerates liquids fine so advised to try to do smoothies, Ensures, etc.   CC:  Lianne Moris, PA-C

## 2021-01-10 DIAGNOSIS — K219 Gastro-esophageal reflux disease without esophagitis: Secondary | ICD-10-CM | POA: Diagnosis not present

## 2021-01-10 DIAGNOSIS — R1013 Epigastric pain: Secondary | ICD-10-CM | POA: Diagnosis not present

## 2021-01-10 DIAGNOSIS — F32A Depression, unspecified: Secondary | ICD-10-CM | POA: Diagnosis not present

## 2021-01-10 DIAGNOSIS — E7849 Other hyperlipidemia: Secondary | ICD-10-CM | POA: Diagnosis not present

## 2021-01-10 DIAGNOSIS — M79604 Pain in right leg: Secondary | ICD-10-CM | POA: Diagnosis not present

## 2021-01-10 DIAGNOSIS — R202 Paresthesia of skin: Secondary | ICD-10-CM | POA: Diagnosis not present

## 2021-01-14 ENCOUNTER — Telehealth: Payer: Self-pay | Admitting: Gastroenterology

## 2021-01-14 NOTE — Telephone Encounter (Signed)
Your case has been Approved. Physician Name: Dr. Stan Head Contact: Vladimir Crofts Physician Address: [address], [place] [address] Phone Number: [phone] Fax Number: [phone] Patient Name: Yesenia Salinas Patient Id: [id number] Insurance Carrier: [employer] Primary Diagnosis Code: R10.13 Description: Epigastric pain Secondary Diagnosis Code: R11.2 Description: Nausea with vomiting, unspecified CPT Code 78227 Description: Birdena Jubilee W/DRUG Authorization Number: [id number] Review Date: 01/14/2021 2:27:30 PM Expiration Date: 02/28/2021 Status: Your case has been Approved

## 2021-01-14 NOTE — Telephone Encounter (Signed)
Es from [place] will speak to [place].

## 2021-01-14 NOTE — Telephone Encounter (Signed)
I have reached out to our PA department to check on the status of PA

## 2021-01-16 ENCOUNTER — Other Ambulatory Visit: Payer: Self-pay

## 2021-01-16 ENCOUNTER — Ambulatory Visit (HOSPITAL_COMMUNITY)
Admission: RE | Admit: 2021-01-16 | Discharge: 2021-01-16 | Disposition: A | Discharge status: Home / Self Care | Payer: Medicare Other | Source: Ambulatory Visit | Attending: Gastroenterology | Admitting: Gastroenterology

## 2021-01-16 ENCOUNTER — Encounter (HOSPITAL_COMMUNITY): Payer: Self-pay

## 2021-01-16 DIAGNOSIS — R112 Nausea with vomiting, unspecified: Secondary | ICD-10-CM | POA: Insufficient documentation

## 2021-01-16 DIAGNOSIS — R1011 Right upper quadrant pain: Secondary | ICD-10-CM | POA: Diagnosis not present

## 2021-01-16 DIAGNOSIS — R1013 Epigastric pain: Secondary | ICD-10-CM

## 2021-01-16 DIAGNOSIS — K219 Gastro-esophageal reflux disease without esophagitis: Secondary | ICD-10-CM | POA: Diagnosis not present

## 2021-01-16 MED ORDER — TECHNETIUM TC 99M MEBROFENIN IV KIT
5.0000 | PACK | Freq: Once | INTRAVENOUS | Status: AC | PRN
  Administered 2021-01-16: 5.2 via INTRAVENOUS

## 2021-01-21 DIAGNOSIS — T85192A Other mechanical complication of implanted electronic neurostimulator (electrode) of spinal cord, initial encounter: Secondary | ICD-10-CM | POA: Diagnosis not present

## 2021-01-21 DIAGNOSIS — R030 Elevated blood-pressure reading, without diagnosis of hypertension: Secondary | ICD-10-CM | POA: Diagnosis not present

## 2021-01-22 NOTE — Progress Notes (Signed)
Addendum: Reviewed and agree with assessment and management plan. Icelynn Onken M, MD  

## 2021-02-27 ENCOUNTER — Encounter: Payer: Self-pay | Admitting: Internal Medicine

## 2021-02-27 ENCOUNTER — Ambulatory Visit (AMBULATORY_SURGERY_CENTER): Payer: Medicare Other | Admitting: Internal Medicine

## 2021-02-27 ENCOUNTER — Other Ambulatory Visit: Payer: Self-pay

## 2021-02-27 VITALS — BP 122/71 | HR 75 | Temp 97.3°F | Resp 11 | Ht 62.5 in | Wt 183.0 lb

## 2021-02-27 DIAGNOSIS — K2970 Gastritis, unspecified, without bleeding: Secondary | ICD-10-CM

## 2021-02-27 DIAGNOSIS — R112 Nausea with vomiting, unspecified: Secondary | ICD-10-CM

## 2021-02-27 DIAGNOSIS — R1013 Epigastric pain: Secondary | ICD-10-CM | POA: Diagnosis not present

## 2021-02-27 DIAGNOSIS — K2100 Gastro-esophageal reflux disease with esophagitis, without bleeding: Secondary | ICD-10-CM | POA: Diagnosis not present

## 2021-02-27 DIAGNOSIS — K319 Disease of stomach and duodenum, unspecified: Secondary | ICD-10-CM | POA: Diagnosis not present

## 2021-02-27 MED ORDER — ESOMEPRAZOLE MAGNESIUM 40 MG PO CPDR: 40.0000 mg | DELAYED_RELEASE_CAPSULE | Freq: Two times a day (BID) | ORAL | 3 refills | Status: DC

## 2021-02-27 MED ORDER — SODIUM CHLORIDE 0.9 % IV SOLN: 500.0000 mL | Freq: Once | INTRAVENOUS | Status: DC

## 2021-02-27 NOTE — Progress Notes (Signed)
Vs by SH.

## 2021-02-27 NOTE — Patient Instructions (Signed)
YOU HAD AN ENDOSCOPIC PROCEDURE TODAY AT THE [facility]:   Refer to the procedure report that was given to you for any specific questions about what was found during the examination.  If the procedure report does not answer your questions, please call your gastroenterologist to clarify.  If you requested that your care partner not be given the details of your procedure findings, then the procedure report has been included in a sealed envelope for you to review at your convenience later.  YOU SHOULD EXPECT: Some feelings of bloating in the abdomen. Passage of more gas than usual.  Walking can help get rid of the air that was put into your GI tract during the procedure and reduce the bloating. If you had a lower endoscopy (such as a colonoscopy or flexible sigmoidoscopy) you may notice spotting of blood in your stool or on the toilet paper. If you underwent a bowel prep for your procedure, you may not have a normal bowel movement for a few days.  Please Note:  You might notice some irritation and congestion in your nose or some drainage.  This is from the oxygen used during your procedure.  There is no need for concern and it should clear up in a day or so.  SYMPTOMS TO REPORT IMMEDIATELY:   Following lower endoscopy (colonoscopy or flexible sigmoidoscopy):  Excessive amounts of blood in the stool  Significant tenderness or worsening of abdominal pains  Swelling of the abdomen that is new, acute  Fever of 100F or higher   Following upper endoscopy (EGD)  Vomiting of blood or coffee ground material  New chest pain or pain under the shoulder blades  Painful or persistently difficult swallowing  New shortness of breath  Fever of 100F or higher  Black, tarry-looking stools  For urgent or emergent issues, a gastroenterologist can be reached at any hour by calling [phone]. Do not use MyChart messaging for urgent concerns.    DIET:  We do recommend a small meal at first, but  then you may proceed to your regular diet.  Drink plenty of fluids but you should avoid alcoholic beverages for 24 hours.  ACTIVITY:  You should plan to take it easy for the rest of today and you should NOT DRIVE or use heavy machinery until tomorrow (because of the sedation medicines used during the test).    FOLLOW UP: Our staff will call the number listed on your records 48-72 hours following your procedure to check on you and address any questions or concerns that you may have regarding the information given to you following your procedure. If we do not reach you, we will leave a message.  We will attempt to reach you two times.  During this call, we will ask if you have developed any symptoms of COVID 19. If you develop any symptoms (ie: fever, flu-like symptoms, shortness of breath, cough etc.) before then, please call [phone].  If you test positive for Covid 19 in the 2 weeks post procedure, please call and report this information to us.    If any biopsies were taken you will be contacted by phone or by letter within the next 1-3 weeks.  Please call us at [phone] if you have not heard about the biopsies in 3 weeks.    SIGNATURES/CONFIDENTIALITY: You and/or your care partner have signed paperwork which will be entered into your electronic medical record.  These signatures attest to the fact that that the information above on   your After Visit Summary has been reviewed and is understood.  Full responsibility of the confidentiality of this discharge information lies with you and/or your care-partner. 

## 2021-02-27 NOTE — Op Note (Signed)
[facility] Patient Name: Yesenia ParkinSindey Salinas Procedure Date: 02/27/2021 9:49 AM MRN: [id number] Endoscopist: Beverley FiedlerJay M Rolly Magri , MD Age: [age] Referring MD:  Date of Birth: 12/24/1965 Gender: Female Account #: [id number] Procedure:                Upper GI endoscopy Indications:              Epigastric abdominal pain and pressure after                            eating, Nausea with vomiting, normal HIDA scan Medicines:                Monitored Anesthesia Care Procedure:                Pre-Anesthesia Assessment:                           - Prior to the procedure, a History and Physical                            was performed, and patient medications and                            allergies were reviewed. The patient's tolerance of                            previous anesthesia was also reviewed. The risks                            and benefits of the procedure and the sedation                            options and risks were discussed with the patient.                            All questions were answered, and informed consent                            was obtained. Prior Anticoagulants: The patient has                            taken no previous anticoagulant or antiplatelet                            agents. ASA Grade Assessment: III - A patient with                            severe systemic disease. After reviewing the risks                            and benefits, the patient was deemed in                            satisfactory condition to undergo the procedure.  After obtaining informed consent, the endoscope was                            passed under direct vision. Throughout the                            procedure, the patient's blood pressure, pulse, and                            oxygen saturations were monitored continuously. The                            Endoscope was introduced through the mouth, and                            advanced to  the second part of duodenum. The upper                            GI endoscopy was accomplished without difficulty.                            The patient tolerated the procedure well. Scope In: Scope Out: Findings:                 In the proximal esophagus there was a small inlet                            patch without nodularity or visible abnormality.                           LA Grade A (one or more mucosal breaks less than 5                            mm, not extending between tops of 2 mucosal folds)                            esophagitis was found at the gastroesophageal                            junction.                           Patchy mildly erythematous mucosa was found in the                            gastric antrum. Biopsies were taken with a cold                            forceps for histology and Helicobacter pylori                            testing.                           The cardia and  gastric fundus were normal on                            retroflexion.                           The examined duodenum was normal. Biopsies for                            histology were taken with a cold forceps for                            evaluation of celiac disease. Complications:            No immediate complications. Estimated Blood Loss:     Estimated blood loss was minimal. Impression:               - Mild reflux (versus related to vomiting)                            esophagitis.                           - Erythematous mucosa in the antrum. Biopsied.                           - Normal examined duodenum. Biopsied. Recommendation:           - Patient has a contact number available for                            emergencies. The signs and symptoms of potential                            delayed complications were discussed with the                            patient. Return to normal activities tomorrow.                            Written discharge instructions were  provided to the                            patient.                           - Resume previous diet.                           - Continue present medications. Continue Nexium 40                            mg BID given mild esophagitis seen today.                           - Await pathology results.                           -  If biopsy results unrevealing I recommended 4 hr                            GES. Beverley Fiedler, MD 02/27/2021 10:12:52 AM This report has been signed electronically.

## 2021-02-27 NOTE — Progress Notes (Signed)
Called to room to assist during endoscopic procedure.  Patient ID and intended procedure confirmed with present staff. Received instructions for my participation in the procedure from the performing physician.  

## 2021-02-27 NOTE — Progress Notes (Signed)
pt tolerated well. VSS. awake and to recovery. Report given to RN. Bite blck left insitu to recovery.

## 2021-03-01 ENCOUNTER — Telehealth: Payer: Self-pay | Admitting: *Deleted

## 2021-03-01 NOTE — Telephone Encounter (Signed)
First attempt, left VM.  

## 2021-03-12 ENCOUNTER — Other Ambulatory Visit: Payer: Self-pay

## 2021-03-12 DIAGNOSIS — R112 Nausea with vomiting, unspecified: Secondary | ICD-10-CM

## 2021-03-12 DIAGNOSIS — R1013 Epigastric pain: Secondary | ICD-10-CM

## 2021-03-28 ENCOUNTER — Other Ambulatory Visit: Payer: Self-pay

## 2021-03-28 ENCOUNTER — Encounter (HOSPITAL_COMMUNITY)
Admission: RE | Admit: 2021-03-28 | Discharge: 2021-03-28 | Disposition: A | Discharge status: Home / Self Care | Payer: Medicare Other | Source: Ambulatory Visit | Attending: Internal Medicine | Admitting: Internal Medicine

## 2021-03-28 DIAGNOSIS — R109 Unspecified abdominal pain: Secondary | ICD-10-CM | POA: Diagnosis not present

## 2021-03-28 DIAGNOSIS — R140 Abdominal distension (gaseous): Secondary | ICD-10-CM | POA: Diagnosis not present

## 2021-03-28 DIAGNOSIS — R112 Nausea with vomiting, unspecified: Secondary | ICD-10-CM | POA: Diagnosis not present

## 2021-03-28 DIAGNOSIS — R1013 Epigastric pain: Secondary | ICD-10-CM | POA: Diagnosis not present

## 2021-03-28 MED ORDER — TECHNETIUM TC 99M SULFUR COLLOID
2.0200 | Freq: Once | INTRAVENOUS | Status: AC
  Administered 2021-03-28: 2.02 via ORAL

## 2021-04-04 DIAGNOSIS — K219 Gastro-esophageal reflux disease without esophagitis: Secondary | ICD-10-CM | POA: Diagnosis not present

## 2021-04-04 DIAGNOSIS — E782 Mixed hyperlipidemia: Secondary | ICD-10-CM | POA: Diagnosis not present

## 2021-04-09 DIAGNOSIS — K219 Gastro-esophageal reflux disease without esophagitis: Secondary | ICD-10-CM | POA: Diagnosis not present

## 2021-04-09 DIAGNOSIS — E7849 Other hyperlipidemia: Secondary | ICD-10-CM | POA: Diagnosis not present

## 2021-04-09 DIAGNOSIS — R1013 Epigastric pain: Secondary | ICD-10-CM | POA: Diagnosis not present

## 2021-04-09 DIAGNOSIS — F5101 Primary insomnia: Secondary | ICD-10-CM | POA: Diagnosis not present

## 2021-04-09 DIAGNOSIS — R112 Nausea with vomiting, unspecified: Secondary | ICD-10-CM | POA: Diagnosis not present

## 2021-04-09 DIAGNOSIS — F32A Depression, unspecified: Secondary | ICD-10-CM | POA: Diagnosis not present

## 2021-04-09 DIAGNOSIS — F17210 Nicotine dependence, cigarettes, uncomplicated: Secondary | ICD-10-CM | POA: Diagnosis not present

## 2021-05-06 DIAGNOSIS — Z0000 Encounter for general adult medical examination without abnormal findings: Secondary | ICD-10-CM | POA: Diagnosis not present

## 2021-05-08 DIAGNOSIS — Z780 Asymptomatic menopausal state: Secondary | ICD-10-CM | POA: Diagnosis not present

## 2021-06-05 DIAGNOSIS — Z780 Asymptomatic menopausal state: Secondary | ICD-10-CM | POA: Diagnosis not present

## 2021-06-10 DIAGNOSIS — R350 Frequency of micturition: Secondary | ICD-10-CM | POA: Diagnosis not present

## 2021-06-10 DIAGNOSIS — R109 Unspecified abdominal pain: Secondary | ICD-10-CM | POA: Diagnosis not present

## 2021-06-11 DIAGNOSIS — R197 Diarrhea, unspecified: Secondary | ICD-10-CM | POA: Diagnosis not present

## 2021-06-11 DIAGNOSIS — R1030 Lower abdominal pain, unspecified: Secondary | ICD-10-CM | POA: Diagnosis not present

## 2021-07-09 DIAGNOSIS — E7800 Pure hypercholesterolemia, unspecified: Secondary | ICD-10-CM | POA: Diagnosis not present

## 2021-07-09 DIAGNOSIS — R5383 Other fatigue: Secondary | ICD-10-CM | POA: Diagnosis not present

## 2021-07-09 DIAGNOSIS — E7849 Other hyperlipidemia: Secondary | ICD-10-CM | POA: Diagnosis not present

## 2021-07-09 DIAGNOSIS — Z1329 Encounter for screening for other suspected endocrine disorder: Secondary | ICD-10-CM | POA: Diagnosis not present

## 2021-07-09 DIAGNOSIS — K219 Gastro-esophageal reflux disease without esophagitis: Secondary | ICD-10-CM | POA: Diagnosis not present

## 2021-07-09 DIAGNOSIS — E782 Mixed hyperlipidemia: Secondary | ICD-10-CM | POA: Diagnosis not present

## 2021-07-11 DIAGNOSIS — F5101 Primary insomnia: Secondary | ICD-10-CM | POA: Diagnosis not present

## 2021-07-11 DIAGNOSIS — K219 Gastro-esophageal reflux disease without esophagitis: Secondary | ICD-10-CM | POA: Diagnosis not present

## 2021-07-11 DIAGNOSIS — E7849 Other hyperlipidemia: Secondary | ICD-10-CM | POA: Diagnosis not present

## 2021-07-11 DIAGNOSIS — Z23 Encounter for immunization: Secondary | ICD-10-CM | POA: Diagnosis not present

## 2021-07-11 DIAGNOSIS — R112 Nausea with vomiting, unspecified: Secondary | ICD-10-CM | POA: Diagnosis not present

## 2021-07-11 DIAGNOSIS — R1013 Epigastric pain: Secondary | ICD-10-CM | POA: Diagnosis not present

## 2021-07-11 DIAGNOSIS — F32A Depression, unspecified: Secondary | ICD-10-CM | POA: Diagnosis not present

## 2021-07-17 DIAGNOSIS — F32A Depression, unspecified: Secondary | ICD-10-CM | POA: Diagnosis not present

## 2021-07-17 DIAGNOSIS — Z0001 Encounter for general adult medical examination with abnormal findings: Secondary | ICD-10-CM | POA: Diagnosis not present

## 2021-07-17 DIAGNOSIS — E782 Mixed hyperlipidemia: Secondary | ICD-10-CM | POA: Diagnosis not present

## 2021-07-17 DIAGNOSIS — Z720 Tobacco use: Secondary | ICD-10-CM | POA: Diagnosis not present

## 2021-07-17 DIAGNOSIS — G4730 Sleep apnea, unspecified: Secondary | ICD-10-CM | POA: Diagnosis not present

## 2021-10-23 DIAGNOSIS — E7800 Pure hypercholesterolemia, unspecified: Secondary | ICD-10-CM | POA: Diagnosis not present

## 2021-10-23 DIAGNOSIS — E7849 Other hyperlipidemia: Secondary | ICD-10-CM | POA: Diagnosis not present

## 2021-10-23 DIAGNOSIS — E782 Mixed hyperlipidemia: Secondary | ICD-10-CM | POA: Diagnosis not present

## 2021-10-23 DIAGNOSIS — R5383 Other fatigue: Secondary | ICD-10-CM | POA: Diagnosis not present

## 2021-10-23 DIAGNOSIS — Z1329 Encounter for screening for other suspected endocrine disorder: Secondary | ICD-10-CM | POA: Diagnosis not present

## 2021-10-30 DIAGNOSIS — R112 Nausea with vomiting, unspecified: Secondary | ICD-10-CM | POA: Diagnosis not present

## 2021-10-30 DIAGNOSIS — R1013 Epigastric pain: Secondary | ICD-10-CM | POA: Diagnosis not present

## 2021-10-30 DIAGNOSIS — F32A Depression, unspecified: Secondary | ICD-10-CM | POA: Diagnosis not present

## 2021-10-30 DIAGNOSIS — F17210 Nicotine dependence, cigarettes, uncomplicated: Secondary | ICD-10-CM | POA: Diagnosis not present

## 2021-10-30 DIAGNOSIS — K219 Gastro-esophageal reflux disease without esophagitis: Secondary | ICD-10-CM | POA: Diagnosis not present

## 2021-10-30 DIAGNOSIS — E038 Other specified hypothyroidism: Secondary | ICD-10-CM | POA: Diagnosis not present

## 2021-10-30 DIAGNOSIS — E7849 Other hyperlipidemia: Secondary | ICD-10-CM | POA: Diagnosis not present

## 2021-11-03 DIAGNOSIS — K219 Gastro-esophageal reflux disease without esophagitis: Secondary | ICD-10-CM | POA: Diagnosis not present

## 2021-11-03 DIAGNOSIS — E782 Mixed hyperlipidemia: Secondary | ICD-10-CM | POA: Diagnosis not present

## 2021-11-04 DIAGNOSIS — R079 Chest pain, unspecified: Secondary | ICD-10-CM | POA: Diagnosis not present

## 2021-11-04 DIAGNOSIS — R0602 Shortness of breath: Secondary | ICD-10-CM | POA: Diagnosis not present

## 2021-11-04 DIAGNOSIS — Z5321 Procedure and treatment not carried out due to patient leaving prior to being seen by health care provider: Secondary | ICD-10-CM | POA: Diagnosis not present

## 2021-11-04 DIAGNOSIS — R42 Dizziness and giddiness: Secondary | ICD-10-CM | POA: Diagnosis not present

## 2021-11-04 DIAGNOSIS — Z87891 Personal history of nicotine dependence: Secondary | ICD-10-CM | POA: Diagnosis not present

## 2021-11-04 DIAGNOSIS — R072 Precordial pain: Secondary | ICD-10-CM | POA: Diagnosis not present

## 2021-11-04 DIAGNOSIS — Z880 Allergy status to penicillin: Secondary | ICD-10-CM | POA: Diagnosis not present

## 2021-11-13 DIAGNOSIS — R748 Abnormal levels of other serum enzymes: Secondary | ICD-10-CM | POA: Diagnosis not present

## 2021-12-12 DIAGNOSIS — Z720 Tobacco use: Secondary | ICD-10-CM | POA: Diagnosis not present

## 2021-12-12 DIAGNOSIS — E038 Other specified hypothyroidism: Secondary | ICD-10-CM | POA: Diagnosis not present

## 2021-12-12 DIAGNOSIS — R1013 Epigastric pain: Secondary | ICD-10-CM | POA: Diagnosis not present

## 2021-12-13 DIAGNOSIS — I700 Atherosclerosis of aorta: Secondary | ICD-10-CM | POA: Diagnosis not present

## 2021-12-13 DIAGNOSIS — R634 Abnormal weight loss: Secondary | ICD-10-CM | POA: Diagnosis not present

## 2021-12-13 DIAGNOSIS — R1013 Epigastric pain: Secondary | ICD-10-CM | POA: Diagnosis not present

## 2021-12-24 DIAGNOSIS — Z720 Tobacco use: Secondary | ICD-10-CM | POA: Diagnosis not present

## 2021-12-24 DIAGNOSIS — J209 Acute bronchitis, unspecified: Secondary | ICD-10-CM | POA: Diagnosis not present

## 2021-12-24 DIAGNOSIS — R059 Cough, unspecified: Secondary | ICD-10-CM | POA: Diagnosis not present

## 2021-12-24 DIAGNOSIS — R197 Diarrhea, unspecified: Secondary | ICD-10-CM | POA: Diagnosis not present

## 2021-12-31 DIAGNOSIS — J439 Emphysema, unspecified: Secondary | ICD-10-CM | POA: Diagnosis not present

## 2021-12-31 DIAGNOSIS — I2510 Atherosclerotic heart disease of native coronary artery without angina pectoris: Secondary | ICD-10-CM | POA: Diagnosis not present

## 2021-12-31 DIAGNOSIS — F17210 Nicotine dependence, cigarettes, uncomplicated: Secondary | ICD-10-CM | POA: Diagnosis not present

## 2022-01-22 DIAGNOSIS — E7849 Other hyperlipidemia: Secondary | ICD-10-CM | POA: Diagnosis not present

## 2022-01-22 DIAGNOSIS — E7800 Pure hypercholesterolemia, unspecified: Secondary | ICD-10-CM | POA: Diagnosis not present

## 2022-01-22 DIAGNOSIS — Z720 Tobacco use: Secondary | ICD-10-CM | POA: Diagnosis not present

## 2022-01-22 DIAGNOSIS — E782 Mixed hyperlipidemia: Secondary | ICD-10-CM | POA: Diagnosis not present

## 2022-01-22 DIAGNOSIS — G4730 Sleep apnea, unspecified: Secondary | ICD-10-CM | POA: Diagnosis not present

## 2022-01-22 DIAGNOSIS — K219 Gastro-esophageal reflux disease without esophagitis: Secondary | ICD-10-CM | POA: Diagnosis not present

## 2022-01-28 DIAGNOSIS — F32A Depression, unspecified: Secondary | ICD-10-CM | POA: Diagnosis not present

## 2022-01-28 DIAGNOSIS — K219 Gastro-esophageal reflux disease without esophagitis: Secondary | ICD-10-CM | POA: Diagnosis not present

## 2022-01-28 DIAGNOSIS — E038 Other specified hypothyroidism: Secondary | ICD-10-CM | POA: Diagnosis not present

## 2022-01-28 DIAGNOSIS — E7849 Other hyperlipidemia: Secondary | ICD-10-CM | POA: Diagnosis not present

## 2022-01-28 DIAGNOSIS — R112 Nausea with vomiting, unspecified: Secondary | ICD-10-CM | POA: Diagnosis not present

## 2022-01-28 DIAGNOSIS — R1013 Epigastric pain: Secondary | ICD-10-CM | POA: Diagnosis not present

## 2022-01-28 DIAGNOSIS — I10 Essential (primary) hypertension: Secondary | ICD-10-CM | POA: Diagnosis not present

## 2022-04-09 DIAGNOSIS — E7849 Other hyperlipidemia: Secondary | ICD-10-CM | POA: Diagnosis not present

## 2022-04-09 DIAGNOSIS — Z1329 Encounter for screening for other suspected endocrine disorder: Secondary | ICD-10-CM | POA: Diagnosis not present

## 2022-04-09 DIAGNOSIS — E038 Other specified hypothyroidism: Secondary | ICD-10-CM | POA: Diagnosis not present

## 2022-04-09 DIAGNOSIS — Z720 Tobacco use: Secondary | ICD-10-CM | POA: Diagnosis not present

## 2022-04-14 DIAGNOSIS — F32A Depression, unspecified: Secondary | ICD-10-CM | POA: Diagnosis not present

## 2022-04-14 DIAGNOSIS — R112 Nausea with vomiting, unspecified: Secondary | ICD-10-CM | POA: Diagnosis not present

## 2022-04-14 DIAGNOSIS — Z79891 Long term (current) use of opiate analgesic: Secondary | ICD-10-CM | POA: Diagnosis not present

## 2022-04-14 DIAGNOSIS — Z5181 Encounter for therapeutic drug level monitoring: Secondary | ICD-10-CM | POA: Diagnosis not present

## 2022-04-14 DIAGNOSIS — M545 Low back pain, unspecified: Secondary | ICD-10-CM | POA: Diagnosis not present

## 2022-04-14 DIAGNOSIS — K219 Gastro-esophageal reflux disease without esophagitis: Secondary | ICD-10-CM | POA: Diagnosis not present

## 2022-04-14 DIAGNOSIS — F17210 Nicotine dependence, cigarettes, uncomplicated: Secondary | ICD-10-CM | POA: Diagnosis not present

## 2022-04-14 DIAGNOSIS — R030 Elevated blood-pressure reading, without diagnosis of hypertension: Secondary | ICD-10-CM | POA: Diagnosis not present

## 2022-04-14 DIAGNOSIS — E7849 Other hyperlipidemia: Secondary | ICD-10-CM | POA: Diagnosis not present

## 2022-04-14 DIAGNOSIS — F5101 Primary insomnia: Secondary | ICD-10-CM | POA: Diagnosis not present

## 2022-04-29 DIAGNOSIS — H6593 Unspecified nonsuppurative otitis media, bilateral: Secondary | ICD-10-CM | POA: Diagnosis not present

## 2022-07-02 DIAGNOSIS — E7849 Other hyperlipidemia: Secondary | ICD-10-CM | POA: Diagnosis not present

## 2022-07-02 DIAGNOSIS — K219 Gastro-esophageal reflux disease without esophagitis: Secondary | ICD-10-CM | POA: Diagnosis not present

## 2022-07-07 DIAGNOSIS — M545 Low back pain, unspecified: Secondary | ICD-10-CM | POA: Diagnosis not present

## 2022-07-07 DIAGNOSIS — F5101 Primary insomnia: Secondary | ICD-10-CM | POA: Diagnosis not present

## 2022-07-07 DIAGNOSIS — R030 Elevated blood-pressure reading, without diagnosis of hypertension: Secondary | ICD-10-CM | POA: Diagnosis not present

## 2022-07-07 DIAGNOSIS — Z23 Encounter for immunization: Secondary | ICD-10-CM | POA: Diagnosis not present

## 2022-07-07 DIAGNOSIS — E7849 Other hyperlipidemia: Secondary | ICD-10-CM | POA: Diagnosis not present

## 2022-07-07 DIAGNOSIS — F32A Depression, unspecified: Secondary | ICD-10-CM | POA: Diagnosis not present

## 2022-07-07 DIAGNOSIS — K219 Gastro-esophageal reflux disease without esophagitis: Secondary | ICD-10-CM | POA: Diagnosis not present

## 2022-07-18 DIAGNOSIS — Z720 Tobacco use: Secondary | ICD-10-CM | POA: Diagnosis not present

## 2022-07-18 DIAGNOSIS — J0101 Acute recurrent maxillary sinusitis: Secondary | ICD-10-CM | POA: Diagnosis not present

## 2022-07-18 DIAGNOSIS — R030 Elevated blood-pressure reading, without diagnosis of hypertension: Secondary | ICD-10-CM | POA: Diagnosis not present

## 2022-09-03 ENCOUNTER — Other Ambulatory Visit (HOSPITAL_COMMUNITY): Payer: Self-pay

## 2022-10-03 DIAGNOSIS — E7800 Pure hypercholesterolemia, unspecified: Secondary | ICD-10-CM | POA: Diagnosis not present

## 2022-10-03 DIAGNOSIS — K219 Gastro-esophageal reflux disease without esophagitis: Secondary | ICD-10-CM | POA: Diagnosis not present

## 2022-10-03 DIAGNOSIS — Z1329 Encounter for screening for other suspected endocrine disorder: Secondary | ICD-10-CM | POA: Diagnosis not present

## 2022-10-03 DIAGNOSIS — Z720 Tobacco use: Secondary | ICD-10-CM | POA: Diagnosis not present

## 2022-10-03 DIAGNOSIS — E7849 Other hyperlipidemia: Secondary | ICD-10-CM | POA: Diagnosis not present

## 2022-10-03 DIAGNOSIS — G4730 Sleep apnea, unspecified: Secondary | ICD-10-CM | POA: Diagnosis not present

## 2022-10-03 DIAGNOSIS — R300 Dysuria: Secondary | ICD-10-CM | POA: Diagnosis not present

## 2022-10-03 DIAGNOSIS — E782 Mixed hyperlipidemia: Secondary | ICD-10-CM | POA: Diagnosis not present

## 2022-10-08 DIAGNOSIS — F32A Depression, unspecified: Secondary | ICD-10-CM | POA: Diagnosis not present

## 2022-10-08 DIAGNOSIS — R300 Dysuria: Secondary | ICD-10-CM | POA: Diagnosis not present

## 2022-10-08 DIAGNOSIS — K219 Gastro-esophageal reflux disease without esophagitis: Secondary | ICD-10-CM | POA: Diagnosis not present

## 2022-10-08 DIAGNOSIS — E7849 Other hyperlipidemia: Secondary | ICD-10-CM | POA: Diagnosis not present

## 2022-10-08 DIAGNOSIS — M545 Low back pain, unspecified: Secondary | ICD-10-CM | POA: Diagnosis not present

## 2022-10-08 DIAGNOSIS — R030 Elevated blood-pressure reading, without diagnosis of hypertension: Secondary | ICD-10-CM | POA: Diagnosis not present

## 2022-10-08 DIAGNOSIS — F17210 Nicotine dependence, cigarettes, uncomplicated: Secondary | ICD-10-CM | POA: Diagnosis not present

## 2022-10-08 DIAGNOSIS — F5101 Primary insomnia: Secondary | ICD-10-CM | POA: Diagnosis not present

## 2022-11-11 DIAGNOSIS — R030 Elevated blood-pressure reading, without diagnosis of hypertension: Secondary | ICD-10-CM | POA: Diagnosis not present

## 2022-11-11 DIAGNOSIS — J209 Acute bronchitis, unspecified: Secondary | ICD-10-CM | POA: Diagnosis not present

## 2022-11-11 DIAGNOSIS — F17210 Nicotine dependence, cigarettes, uncomplicated: Secondary | ICD-10-CM | POA: Diagnosis not present

## 2022-12-01 DIAGNOSIS — R059 Cough, unspecified: Secondary | ICD-10-CM | POA: Diagnosis not present

## 2022-12-01 DIAGNOSIS — J029 Acute pharyngitis, unspecified: Secondary | ICD-10-CM | POA: Diagnosis not present

## 2022-12-01 DIAGNOSIS — F17210 Nicotine dependence, cigarettes, uncomplicated: Secondary | ICD-10-CM | POA: Diagnosis not present

## 2022-12-01 DIAGNOSIS — J019 Acute sinusitis, unspecified: Secondary | ICD-10-CM | POA: Diagnosis not present

## 2022-12-01 DIAGNOSIS — R509 Fever, unspecified: Secondary | ICD-10-CM | POA: Diagnosis not present

## 2022-12-01 DIAGNOSIS — R030 Elevated blood-pressure reading, without diagnosis of hypertension: Secondary | ICD-10-CM | POA: Diagnosis not present

## 2022-12-24 DIAGNOSIS — E782 Mixed hyperlipidemia: Secondary | ICD-10-CM | POA: Diagnosis not present

## 2022-12-24 DIAGNOSIS — R5383 Other fatigue: Secondary | ICD-10-CM | POA: Diagnosis not present

## 2022-12-24 DIAGNOSIS — Z1329 Encounter for screening for other suspected endocrine disorder: Secondary | ICD-10-CM | POA: Diagnosis not present

## 2022-12-31 DIAGNOSIS — F5101 Primary insomnia: Secondary | ICD-10-CM | POA: Diagnosis not present

## 2022-12-31 DIAGNOSIS — F17210 Nicotine dependence, cigarettes, uncomplicated: Secondary | ICD-10-CM | POA: Diagnosis not present

## 2022-12-31 DIAGNOSIS — E7849 Other hyperlipidemia: Secondary | ICD-10-CM | POA: Diagnosis not present

## 2022-12-31 DIAGNOSIS — M545 Low back pain, unspecified: Secondary | ICD-10-CM | POA: Diagnosis not present

## 2022-12-31 DIAGNOSIS — R030 Elevated blood-pressure reading, without diagnosis of hypertension: Secondary | ICD-10-CM | POA: Diagnosis not present

## 2023-01-12 DIAGNOSIS — J019 Acute sinusitis, unspecified: Secondary | ICD-10-CM | POA: Diagnosis not present

## 2023-01-12 DIAGNOSIS — J209 Acute bronchitis, unspecified: Secondary | ICD-10-CM | POA: Diagnosis not present

## 2023-01-12 DIAGNOSIS — R030 Elevated blood-pressure reading, without diagnosis of hypertension: Secondary | ICD-10-CM | POA: Diagnosis not present

## 2023-01-12 DIAGNOSIS — F17210 Nicotine dependence, cigarettes, uncomplicated: Secondary | ICD-10-CM | POA: Diagnosis not present

## 2023-01-12 DIAGNOSIS — R059 Cough, unspecified: Secondary | ICD-10-CM | POA: Diagnosis not present

## 2023-01-27 DIAGNOSIS — Z1231 Encounter for screening mammogram for malignant neoplasm of breast: Secondary | ICD-10-CM | POA: Diagnosis not present

## 2023-02-11 DIAGNOSIS — R5383 Other fatigue: Secondary | ICD-10-CM | POA: Diagnosis not present

## 2023-02-11 DIAGNOSIS — E7849 Other hyperlipidemia: Secondary | ICD-10-CM | POA: Diagnosis not present

## 2023-02-11 DIAGNOSIS — Z1329 Encounter for screening for other suspected endocrine disorder: Secondary | ICD-10-CM | POA: Diagnosis not present

## 2023-02-11 DIAGNOSIS — R634 Abnormal weight loss: Secondary | ICD-10-CM | POA: Diagnosis not present

## 2023-03-25 DIAGNOSIS — K219 Gastro-esophageal reflux disease without esophagitis: Secondary | ICD-10-CM | POA: Diagnosis not present

## 2023-03-25 DIAGNOSIS — F32A Depression, unspecified: Secondary | ICD-10-CM | POA: Diagnosis not present

## 2023-03-25 DIAGNOSIS — F5101 Primary insomnia: Secondary | ICD-10-CM | POA: Diagnosis not present

## 2023-03-25 DIAGNOSIS — E7849 Other hyperlipidemia: Secondary | ICD-10-CM | POA: Diagnosis not present

## 2023-03-25 DIAGNOSIS — F17210 Nicotine dependence, cigarettes, uncomplicated: Secondary | ICD-10-CM | POA: Diagnosis not present

## 2023-03-25 DIAGNOSIS — M545 Low back pain, unspecified: Secondary | ICD-10-CM | POA: Diagnosis not present

## 2023-04-21 DIAGNOSIS — M25561 Pain in right knee: Secondary | ICD-10-CM | POA: Diagnosis not present

## 2023-06-12 DIAGNOSIS — K219 Gastro-esophageal reflux disease without esophagitis: Secondary | ICD-10-CM | POA: Diagnosis not present

## 2023-06-12 DIAGNOSIS — E782 Mixed hyperlipidemia: Secondary | ICD-10-CM | POA: Diagnosis not present

## 2023-06-12 DIAGNOSIS — E038 Other specified hypothyroidism: Secondary | ICD-10-CM | POA: Diagnosis not present

## 2023-06-12 DIAGNOSIS — E7849 Other hyperlipidemia: Secondary | ICD-10-CM | POA: Diagnosis not present

## 2023-06-19 DIAGNOSIS — E7849 Other hyperlipidemia: Secondary | ICD-10-CM | POA: Diagnosis not present

## 2023-06-19 DIAGNOSIS — Z23 Encounter for immunization: Secondary | ICD-10-CM | POA: Diagnosis not present

## 2023-06-19 DIAGNOSIS — M545 Low back pain, unspecified: Secondary | ICD-10-CM | POA: Diagnosis not present

## 2023-06-19 DIAGNOSIS — F5101 Primary insomnia: Secondary | ICD-10-CM | POA: Diagnosis not present

## 2023-06-19 DIAGNOSIS — M25561 Pain in right knee: Secondary | ICD-10-CM | POA: Diagnosis not present

## 2023-06-19 DIAGNOSIS — K219 Gastro-esophageal reflux disease without esophagitis: Secondary | ICD-10-CM | POA: Diagnosis not present

## 2023-06-19 DIAGNOSIS — R030 Elevated blood-pressure reading, without diagnosis of hypertension: Secondary | ICD-10-CM | POA: Diagnosis not present

## 2023-06-27 DIAGNOSIS — Z880 Allergy status to penicillin: Secondary | ICD-10-CM | POA: Diagnosis not present

## 2023-06-27 DIAGNOSIS — N3001 Acute cystitis with hematuria: Secondary | ICD-10-CM | POA: Diagnosis not present

## 2023-06-27 DIAGNOSIS — K429 Umbilical hernia without obstruction or gangrene: Secondary | ICD-10-CM | POA: Diagnosis not present

## 2023-06-27 DIAGNOSIS — N3090 Cystitis, unspecified without hematuria: Secondary | ICD-10-CM | POA: Diagnosis not present

## 2023-06-27 DIAGNOSIS — J45909 Unspecified asthma, uncomplicated: Secondary | ICD-10-CM | POA: Diagnosis not present

## 2023-06-27 DIAGNOSIS — Z780 Asymptomatic menopausal state: Secondary | ICD-10-CM | POA: Diagnosis not present

## 2023-06-27 DIAGNOSIS — F17210 Nicotine dependence, cigarettes, uncomplicated: Secondary | ICD-10-CM | POA: Diagnosis not present

## 2023-06-27 DIAGNOSIS — Z79899 Other long term (current) drug therapy: Secondary | ICD-10-CM | POA: Diagnosis not present

## 2023-06-27 DIAGNOSIS — G43909 Migraine, unspecified, not intractable, without status migrainosus: Secondary | ICD-10-CM | POA: Diagnosis not present

## 2023-10-02 DIAGNOSIS — R030 Elevated blood-pressure reading, without diagnosis of hypertension: Secondary | ICD-10-CM | POA: Diagnosis not present

## 2023-10-02 DIAGNOSIS — R300 Dysuria: Secondary | ICD-10-CM | POA: Diagnosis not present

## 2023-10-02 DIAGNOSIS — J069 Acute upper respiratory infection, unspecified: Secondary | ICD-10-CM | POA: Diagnosis not present

## 2023-10-02 DIAGNOSIS — F17210 Nicotine dependence, cigarettes, uncomplicated: Secondary | ICD-10-CM | POA: Diagnosis not present

## 2023-10-02 DIAGNOSIS — J020 Streptococcal pharyngitis: Secondary | ICD-10-CM | POA: Diagnosis not present

## 2023-10-15 DIAGNOSIS — E038 Other specified hypothyroidism: Secondary | ICD-10-CM | POA: Diagnosis not present

## 2023-10-15 DIAGNOSIS — R5383 Other fatigue: Secondary | ICD-10-CM | POA: Diagnosis not present

## 2023-10-15 DIAGNOSIS — E7849 Other hyperlipidemia: Secondary | ICD-10-CM | POA: Diagnosis not present

## 2023-10-15 DIAGNOSIS — E782 Mixed hyperlipidemia: Secondary | ICD-10-CM | POA: Diagnosis not present

## 2023-10-21 DIAGNOSIS — E782 Mixed hyperlipidemia: Secondary | ICD-10-CM | POA: Diagnosis not present

## 2023-10-21 DIAGNOSIS — Z0001 Encounter for general adult medical examination with abnormal findings: Secondary | ICD-10-CM | POA: Diagnosis not present

## 2023-10-21 DIAGNOSIS — M545 Low back pain, unspecified: Secondary | ICD-10-CM | POA: Diagnosis not present

## 2023-10-21 DIAGNOSIS — J209 Acute bronchitis, unspecified: Secondary | ICD-10-CM | POA: Diagnosis not present

## 2023-10-21 DIAGNOSIS — R030 Elevated blood-pressure reading, without diagnosis of hypertension: Secondary | ICD-10-CM | POA: Diagnosis not present

## 2024-01-19 DIAGNOSIS — E038 Other specified hypothyroidism: Secondary | ICD-10-CM | POA: Diagnosis not present

## 2024-01-19 DIAGNOSIS — R300 Dysuria: Secondary | ICD-10-CM | POA: Diagnosis not present

## 2024-01-19 DIAGNOSIS — E7800 Pure hypercholesterolemia, unspecified: Secondary | ICD-10-CM | POA: Diagnosis not present

## 2024-01-19 DIAGNOSIS — E7849 Other hyperlipidemia: Secondary | ICD-10-CM | POA: Diagnosis not present

## 2024-01-26 DIAGNOSIS — E038 Other specified hypothyroidism: Secondary | ICD-10-CM | POA: Diagnosis not present

## 2024-01-26 DIAGNOSIS — E782 Mixed hyperlipidemia: Secondary | ICD-10-CM | POA: Diagnosis not present

## 2024-01-26 DIAGNOSIS — M545 Low back pain, unspecified: Secondary | ICD-10-CM | POA: Diagnosis not present

## 2024-02-09 DIAGNOSIS — Z1231 Encounter for screening mammogram for malignant neoplasm of breast: Secondary | ICD-10-CM | POA: Diagnosis not present

## 2024-02-12 DIAGNOSIS — E038 Other specified hypothyroidism: Secondary | ICD-10-CM | POA: Diagnosis not present

## 2024-02-12 DIAGNOSIS — M79604 Pain in right leg: Secondary | ICD-10-CM | POA: Diagnosis not present

## 2024-04-27 DIAGNOSIS — E038 Other specified hypothyroidism: Secondary | ICD-10-CM | POA: Diagnosis not present

## 2024-04-27 DIAGNOSIS — E7849 Other hyperlipidemia: Secondary | ICD-10-CM | POA: Diagnosis not present

## 2024-04-27 DIAGNOSIS — Z0001 Encounter for general adult medical examination with abnormal findings: Secondary | ICD-10-CM | POA: Diagnosis not present

## 2024-05-04 DIAGNOSIS — E7849 Other hyperlipidemia: Secondary | ICD-10-CM | POA: Diagnosis not present

## 2024-05-04 DIAGNOSIS — E039 Hypothyroidism, unspecified: Secondary | ICD-10-CM | POA: Diagnosis not present

## 2024-05-04 DIAGNOSIS — M545 Low back pain, unspecified: Secondary | ICD-10-CM | POA: Diagnosis not present

## 2024-05-04 DIAGNOSIS — E782 Mixed hyperlipidemia: Secondary | ICD-10-CM | POA: Diagnosis not present

## 2024-05-11 DIAGNOSIS — M4807 Spinal stenosis, lumbosacral region: Secondary | ICD-10-CM | POA: Diagnosis not present

## 2024-05-11 DIAGNOSIS — M48061 Spinal stenosis, lumbar region without neurogenic claudication: Secondary | ICD-10-CM | POA: Diagnosis not present

## 2024-05-11 DIAGNOSIS — M5136 Other intervertebral disc degeneration, lumbar region with discogenic back pain only: Secondary | ICD-10-CM | POA: Diagnosis not present

## 2024-05-11 DIAGNOSIS — M4317 Spondylolisthesis, lumbosacral region: Secondary | ICD-10-CM | POA: Diagnosis not present

## 2024-05-16 DIAGNOSIS — T85192A Other mechanical complication of implanted electronic neurostimulator (electrode) of spinal cord, initial encounter: Secondary | ICD-10-CM | POA: Diagnosis not present

## 2024-05-16 DIAGNOSIS — M5416 Radiculopathy, lumbar region: Secondary | ICD-10-CM | POA: Diagnosis not present

## 2024-06-08 DIAGNOSIS — J029 Acute pharyngitis, unspecified: Secondary | ICD-10-CM | POA: Diagnosis not present

## 2024-06-08 DIAGNOSIS — J069 Acute upper respiratory infection, unspecified: Secondary | ICD-10-CM | POA: Diagnosis not present

## 2024-11-01 ENCOUNTER — Other Ambulatory Visit: Payer: Self-pay | Admitting: *Deleted

## 2024-11-01 DIAGNOSIS — Z1211 Encounter for screening for malignant neoplasm of colon: Secondary | ICD-10-CM

## 2024-11-10 ENCOUNTER — Encounter: Payer: Self-pay | Admitting: Surgery

## 2024-11-10 ENCOUNTER — Ambulatory Visit: Admitting: Surgery

## 2024-11-10 VITALS — BP 113/74 | HR 84 | Temp 98.5°F | Resp 16 | Ht 62.5 in | Wt 187.0 lb

## 2024-11-10 DIAGNOSIS — Z1211 Encounter for screening for malignant neoplasm of colon: Secondary | ICD-10-CM

## 2024-11-10 MED ORDER — SUTAB 1479-225-188 MG PO TABS: ORAL_TABLET | ORAL | 0 refills | Status: AC

## 2024-11-10 NOTE — Progress Notes (Unsigned)
 [facility] History and Physical  Reason for Referral:*** Referring Physician: ***  Chief Complaint   New Patient (Initial Visit); Colonoscopy     Yesenia Salinas is a 59 y.o. female.  HPI: ***.  The *** started *** and has had a duration of ***.  It is associated with ***.  The *** is improved with ***, and is made worse with ***.    Quality*** Context***  Past Medical History:  Diagnosis Date   Anxiety    Asthma    Depression    Diarrhea    GERD (gastroesophageal reflux disease)    HLD (hyperlipidemia)    Lumbar spondylosis    RLS (restless legs syndrome)    S/P PICC central line placement    Sepsis (HCC)    Sleep apnea     Past Surgical History:  Procedure Laterality Date   APPENDECTOMY     COLONOSCOPY  09/2015   Dr Donnel    ECTOPIC PREGNANCY SURGERY     fallopian tubes removed   SPINAL CAGE     SPINAL CORD STIMULATOR INSERTION     x 2   SPINAL FUSION Right    with partial hip surgery   TONSILLECTOMY      Family History  Problem Relation Age of Onset   Uterine cancer Mother    Ovarian cancer Mother    Cervical cancer Mother    COPD Father    Heart failure Father    Diabetes Father    Pancreatic cancer Brother    Crohn's disease Brother    Other Other        brights disease runs in the family    Blindness Brother    Diabetes Paternal Grandmother    Diabetes Paternal Uncle    Kidney disease Cousin        1st cousins   Mental retardation Son    Autism Grandson    Cleft lip Granddaughter    Cleft palate Granddaughter     Social History[1]  Medications: {medication reviewed/display:3041432} Allergies as of 11/10/2024       Reactions   Penicillin G Benzathine Hives        Medication List        Accurate as of November 10, 2024  9:26 AM. If you have any questions, ask your nurse or doctor.          STOP taking these medications    esomeprazole  40 MG capsule Commonly known as:  NexIUM  Stopped by: Yesenia Anahis Furgeson, DO   promethazine  25 MG tablet Commonly known as: PHENERGAN  Stopped by: Yesenia Riane Rung, DO   simvastatin 40 MG tablet Commonly known as: ZOCOR Stopped by: Yesenia Amaurie Schreckengost, DO   sucralfate 1 g tablet Commonly known as: CARAFATE Stopped by: Yesenia Nassim Cosma, DO       TAKE these medications    albuterol 108 (90 Base) MCG/ACT inhaler Commonly known as: VENTOLIN HFA Inhale 2 puffs into the lungs every 4 (four) hours as needed for shortness of breath or wheezing.   atorvastatin 10 MG tablet Commonly known as: LIPITOR Take 10 mg by mouth daily.   cyclobenzaprine 10 MG tablet Commonly known as: FLEXERIL Take 1 tablet by mouth 3 (three) times daily.   diazepam 10 MG tablet Commonly known as: VALIUM Take 10 mg by mouth 3 (three) times daily.   ezetimibe 10 MG tablet Commonly known as: ZETIA Take 10 mg by mouth daily.   gabapentin 100 MG capsule Commonly known as: NEURONTIN Take 100 mg by  mouth 3 (three) times daily.   oxyCODONE-acetaminophen  10-325 MG tablet Commonly known as: PERCOCET Take 1 tablet by mouth every 6 (six) hours as needed.         ROS:  {Review of Systems:30496}  Blood pressure 113/74, pulse 84, temperature 98.5 F (36.9 C), temperature source Oral, resp. rate 16, height 5' 2.5 (1.588 m), weight 187 lb (84.8 kg), SpO2 96%. Physical Exam  Results: No results found for this or any previous visit (from the past 48 hours).  No results found.   Assessment & Plan:  Yesenia Salinas is a 59 y.o. female with *** -*** -*** -Follow up ***  All questions were answered to the satisfaction of the patient and family***.  The risk and benefits of *** were discussed including but not limited to ***.  After careful consideration, Yesenia Salinas has decided to ***.    Yesenia Salinas 11/10/2024, 9:26 AM   Note: Portions of this report may have been transcribed using voice recognition  software. Every effort has been made to ensure accuracy; however, inadvertent computerized transcription errors may still be present.   Yesenia Brittle, DO [facility] [address] [place], [place] [address] [phone] (office)           [1] Social History Tobacco Use   Smoking status: Every Day   Smokeless tobacco: Never  Vaping Use   Vaping status: Never Used  Substance Use Topics   Alcohol  use: Yes    Comment: occasional   Drug use: Never

## 2024-11-11 NOTE — H&P (Signed)
 [facility] History and Physical  Reason for Referral: Colonoscopy  Referring Physician: Rocky Don, PA-C  Chief Complaint   New Patient (Initial Visit); Colonoscopy     Yesenia Salinas is a 59 y.o. female.  HPI: Patient presents for screening colonoscopy.  Her last colonoscopy was in 2016 with Dr. Donnel, and she was only noted to have diverticulosis at that time.  She denies any abdominal pain, nausea, vomiting, melena, and hematochezia.  She denies any family history of colorectal cancer.  She does state that she believes she had some type of abnormal reaction to the anesthesia at the time of her colonoscopy, however she has had back surgery since then without issue related to the anesthesia.  Her past medical history is significant for anxiety/depression, hypertension, hyperlipidemia, restless leg syndrome, and lumbar spondylosis.  She denies use of blood thinning medications.  Her surgical history is significant for an open appendectomy, open salpingo ectomy secondary to an ectopic pregnancy, and multiple open ovarian cystectomies.  She smokes 3 to 4 cigarettes/day and occasionally drinks alcohol  on the weekends.  She denies use of illicit drugs.  Past Medical History:  Diagnosis Date   Anxiety    Asthma    Depression    Diarrhea    GERD (gastroesophageal reflux disease)    HLD (hyperlipidemia)    Lumbar spondylosis    RLS (restless legs syndrome)    S/P PICC central line placement    Sepsis (HCC)    Sleep apnea     Past Surgical History:  Procedure Laterality Date   APPENDECTOMY     COLONOSCOPY  09/2015   Dr Donnel    ECTOPIC PREGNANCY SURGERY     fallopian tubes removed   SPINAL CAGE     SPINAL CORD STIMULATOR INSERTION     x 2   SPINAL FUSION Right    with partial hip surgery   TONSILLECTOMY      Family History  Problem Relation Age of Onset   Uterine cancer Mother    Ovarian cancer Mother    Cervical cancer Mother    COPD Father    Heart  failure Father    Diabetes Father    Pancreatic cancer Brother    Crohn's disease Brother    Other Other        brights disease runs in the family    Blindness Brother    Diabetes Paternal Grandmother    Diabetes Paternal Uncle    Kidney disease Cousin        1st cousins   Mental retardation Son    Autism Grandson    Cleft lip Granddaughter    Cleft palate Granddaughter     [Social History]  [Social History] Tobacco Use   Smoking status: Every Day   Smokeless tobacco: Never  Vaping Use   Vaping status: Never Used  Substance Use Topics   Alcohol  use: Yes    Comment: occasional   Drug use: Never    Medications: I have reviewed the patient's current medications. Allergies as of 11/10/2024       Reactions   Penicillin G Benzathine Hives        Medication List        Accurate as of November 10, 2024  9:26 AM. If you have any questions, ask your nurse or doctor.          STOP taking these medications    esomeprazole  40 MG capsule Commonly known as: NexIUM  Stopped by: Dorothyann Brittle, DO  promethazine  25 MG tablet Commonly known as: PHENERGAN  Stopped by: Dorothyann Camilo Mander, DO   simvastatin 40 MG tablet Commonly known as: ZOCOR Stopped by: Dorothyann Seaver Machia, DO   sucralfate 1 g tablet Commonly known as: CARAFATE Stopped by: Dorothyann Rayvn Rickerson, DO       TAKE these medications    albuterol 108 (90 Base) MCG/ACT inhaler Commonly known as: VENTOLIN HFA Inhale 2 puffs into the lungs every 4 (four) hours as needed for shortness of breath or wheezing.   atorvastatin 10 MG tablet Commonly known as: LIPITOR Take 10 mg by mouth daily.   cyclobenzaprine 10 MG tablet Commonly known as: FLEXERIL Take 1 tablet by mouth 3 (three) times daily.   diazepam 10 MG tablet Commonly known as: VALIUM Take 10 mg by mouth 3 (three) times daily.   ezetimibe 10 MG tablet Commonly known as: ZETIA Take 10 mg by mouth daily.   gabapentin 100 MG  capsule Commonly known as: NEURONTIN Take 100 mg by mouth 3 (three) times daily.   oxyCODONE-acetaminophen  10-325 MG tablet Commonly known as: PERCOCET Take 1 tablet by mouth every 6 (six) hours as needed.         ROS:  Constitutional: negative for chills, fatigue, and fevers Eyes: negative for visual disturbance and pain Ears, nose, mouth, throat, and face: negative for ear drainage, sore throat, and sinus problems Respiratory: negative for cough, wheezing, and shortness of breath Cardiovascular: negative for chest pain and palpitations Gastrointestinal: negative for abdominal pain, nausea, reflux symptoms, and vomiting Genitourinary:negative for dysuria and frequency Integument/breast: negative for dryness and rash Hematologic/lymphatic: negative for bleeding and lymphadenopathy Musculoskeletal:positive for back pain, negative for neck pain Neurological: negative for dizziness and tremors Endocrine: negative for temperature intolerance  Blood pressure 113/74, pulse 84, temperature 98.5 F (36.9 C), temperature source Oral, resp. rate 16, height 5' 2.5 (1.588 m), weight 187 lb (84.8 kg), SpO2 96%. Physical Exam Vitals reviewed.  Constitutional:      Appearance: Normal appearance.  HENT:     Head: Normocephalic and atraumatic.  Eyes:     Extraocular Movements: Extraocular movements intact.     Pupils: Pupils are equal, round, and reactive to light.  Cardiovascular:     Rate and Rhythm: Normal rate and regular rhythm.  Pulmonary:     Effort: Pulmonary effort is normal.     Breath sounds: Normal breath sounds.  Abdominal:     Comments: Abdomen soft, nondistended, no percussion tenderness, nontender to palpation; no rigidity, guarding, or rebound tenderness  Musculoskeletal:        General: Normal range of motion.     Cervical back: Normal range of motion.  Skin:    General: Skin is warm and dry.  Neurological:     General: No focal deficit present.     Mental  Status: She is alert and oriented to person, place, and time.  Psychiatric:        Mood and Affect: Mood normal.        Behavior: Behavior normal.     Results: No results found for this or any previous visit (from the past 48 hours).  No results found.   Assessment & Plan:  Yesenia Salinas is a 59 y.o. female who presents for screening colonoscopy  -We discussed methods for screening for colon cancer, and the role colonoscopy plays in screening for colon cancer -The risk and benefits of colonoscopy were discussed including but not limited to bleeding, infection, missed lesions, and perforation of colon requiring surgery.  After careful consideration, Adilenne Miracle has decided to proceed with colonoscopy.  -Patient tentatively scheduled for colonoscopy on 2/17 -Prescription provided to the patient for Sutab  prep, as well as an instruction sheet on how to appropriately administer the prep. -Coupon also provided to the patient for Sutab  prep -Information provided to the patient regarding colonoscopy and Sutab  prep  All questions were answered to the satisfaction of the patient.  Note: Portions of this report may have been transcribed using voice recognition software. Every effort has been made to ensure accuracy; however, inadvertent computerized transcription errors may still be present.   Dorothyann Brittle, DO [facility] [address] [place], [place] [address] [phone] (office)

## 2024-11-22 ENCOUNTER — Ambulatory Visit (HOSPITAL_COMMUNITY): Admit: 2024-11-22 | Admitting: Surgery

## 2024-11-22 ENCOUNTER — Encounter (HOSPITAL_COMMUNITY): Payer: Self-pay
